# Patient Record
Sex: Male | Born: 1955 | Race: Black or African American | Hispanic: No | Marital: Married | Smoking: Former smoker
Health system: Southern US, Community
[De-identification: ages and names within clinical notes are randomized; demographics above are authoritative.]

## PROBLEM LIST (undated history)

## (undated) DIAGNOSIS — IMO0002 Reserved for concepts with insufficient information to code with codable children: Secondary | ICD-10-CM

## (undated) DIAGNOSIS — G4733 Obstructive sleep apnea (adult) (pediatric): Secondary | ICD-10-CM

## (undated) DIAGNOSIS — K5792 Diverticulitis of intestine, part unspecified, without perforation or abscess without bleeding: Secondary | ICD-10-CM

## (undated) DIAGNOSIS — Z87442 Personal history of urinary calculi: Secondary | ICD-10-CM

## (undated) DIAGNOSIS — I10 Essential (primary) hypertension: Secondary | ICD-10-CM

## (undated) DIAGNOSIS — E0500 Thyrotoxicosis with diffuse goiter without thyrotoxic crisis or storm: Secondary | ICD-10-CM

## (undated) DIAGNOSIS — E119 Type 2 diabetes mellitus without complications: Secondary | ICD-10-CM

## (undated) HISTORY — PX: KNEE SURGERY: SHX244

## (undated) HISTORY — DX: Obstructive sleep apnea (adult) (pediatric): G47.33

## (undated) HISTORY — DX: Essential (primary) hypertension: I10

## (undated) HISTORY — DX: Type 2 diabetes mellitus without complications: E11.9

## (undated) HISTORY — DX: Thyrotoxicosis with diffuse goiter without thyrotoxic crisis or storm: E05.00

## (undated) HISTORY — DX: Diverticulitis of intestine, part unspecified, without perforation or abscess without bleeding: K57.92

## (undated) HISTORY — PX: APPENDECTOMY: SHX54

## (undated) MED ORDER — OXYCODONE-ACETAMINOPHEN 5 MG-325 MG TAB
5-325 mg | ORAL_TABLET | ORAL | Status: DC | PRN
Start: ? — End: 2014-04-17

## (undated) MED ORDER — TAMSULOSIN SR 0.4 MG 24 HR CAP
0.4 mg | ORAL_CAPSULE | Freq: Every day | ORAL | Status: AC
Start: ? — End: 2012-08-22

## (undated) MED ORDER — ONDANSETRON HCL 4 MG TAB
4 mg | ORAL_TABLET | Freq: Three times a day (TID) | ORAL | Status: DC | PRN
Start: ? — End: 2014-04-17

---

## 2008-04-26 LAB — URINALYSIS W/ RFLX MICROSCOPIC
Bilirubin UA, confirm: NEGATIVE
Bilirubin: NEGATIVE
Blood: NEGATIVE
Glucose: NEGATIVE MG/DL
Leukocyte Esterase: NEGATIVE
Nitrites: NEGATIVE
Protein: 100 MG/DL — AB
Specific gravity: >1.03 — ABNORMAL HIGH (ref 1.003–1.030)
Urobilinogen: 0.2 EU/DL (ref 0.2–1.0)
pH (UA): 5 (ref 5.0–8.0)

## 2008-04-26 LAB — CBC WITH AUTOMATED DIFF
ABS. LYMPHOCYTES: 1.5 10*3/uL (ref 0.8–3.5)
ABS. MONOCYTES: 0.5 10*3/uL (ref 0–1.0)
ABS. NEUTROPHILS: 9.4 10*3/uL — ABNORMAL HIGH (ref 1.8–8.0)
BASOPHILS: 0 % (ref 0–3)
EOSINOPHILS: 2 % (ref 0–5)
HCT: 39.6 % (ref 37.0–49.0)
HGB: 13.1 g/dL (ref 13.0–16.0)
LYMPHOCYTES: 13 % — ABNORMAL LOW (ref 20–51)
MCH: 30.9 PG (ref 25.0–35.0)
MCHC: 33.1 g/dL (ref 31.0–37.0)
MCV: 93.4 FL (ref 78.0–98.0)
MONOCYTES: 5 % (ref 2–9)
MPV: 8.1 FL (ref 7.4–10.4)
NEUTROPHILS: 80 % — ABNORMAL HIGH (ref 42–75)
PLATELET: 277 10*3/uL (ref 130–400)
RBC: 4.25 M/uL — ABNORMAL LOW (ref 4.50–5.30)
RDW: 12.7 % (ref 11.5–14.5)
WBC: 11.7 10*3/uL (ref 4.5–13.0)

## 2008-04-26 LAB — CARDIAC PANEL,(CK, CKMB & TROPONIN)
CK - MB: 2.2 ng/ml (ref 0.5–3.6)
CK-MB Index: 0.8 % (ref 0.0–4.0)
CK: 291 U/L — ABNORMAL HIGH (ref 35–232)
Troponin-I, QT: <0.04 NG/ML (ref 0.00–0.05)

## 2008-04-26 LAB — METABOLIC PANEL, COMPREHENSIVE
A-G Ratio: 1.3 (ref 0.8–1.7)
ALT (SGPT): 43 U/L (ref 30–65)
AST (SGOT): 22 U/L (ref 15–37)
Albumin: 4.8 g/dL (ref 3.4–5.0)
Alk. phosphatase: 84 U/L (ref 50–136)
Anion gap: 12 mmol/L (ref 5–15)
BUN/Creatinine ratio: 12 (ref 12–20)
BUN: 29 MG/DL — ABNORMAL HIGH (ref 7–18)
Bilirubin, total: 0.4 MG/DL (ref 0.1–0.9)
CO2: 24 MMOL/L (ref 21–32)
Calcium: 8.8 MG/DL (ref 8.4–10.4)
Chloride: 101 MMOL/L (ref 100–108)
Creatinine: 2.5 MG/DL — ABNORMAL HIGH (ref 0.6–1.3)
GFR est AA: 35 mL/min/{1.73_m2} — ABNORMAL LOW (ref >60)
GFR est non-AA: 29 mL/min/{1.73_m2} — ABNORMAL LOW (ref >60)
Globulin: 3.6 g/dL (ref 2.0–4.0)
Glucose: 212 MG/DL — ABNORMAL HIGH (ref 74–99)
Potassium: 5.4 MMOL/L (ref 3.5–5.5)
Protein, total: 8.4 g/dL — ABNORMAL HIGH (ref 6.4–8.2)
Sodium: 137 MMOL/L (ref 136–145)

## 2008-04-26 LAB — PTT: aPTT: 26.2 s (ref 24.6–37.7)

## 2008-04-26 LAB — PROTHROMBIN TIME + INR
INR: 1 (ref 0.0–1.2)
Prothrombin time: 12.8 SECS (ref 11.5–15.2)

## 2008-04-26 LAB — URINE MICROSCOPIC ONLY
RBC: 0 /HPF (ref 0–5)
WBC: 0 /HPF (ref 0–5)

## 2008-04-27 LAB — CBC WITH AUTOMATED DIFF
ABS. LYMPHOCYTES: 2.1 10*3/uL (ref 0.8–3.5)
ABS. MONOCYTES: 0.6 10*3/uL (ref 0–1.0)
ABS. NEUTROPHILS: 5.7 10*3/uL (ref 1.8–8.0)
BASOPHILS: 1 % (ref 0–3)
EOSINOPHILS: 4 % (ref 0–5)
HCT: 35.5 % — ABNORMAL LOW (ref 37.0–49.0)
HGB: 11.9 g/dL — ABNORMAL LOW (ref 13.0–16.0)
LYMPHOCYTES: 24 % (ref 20–51)
MCH: 31.3 PG (ref 25.0–35.0)
MCHC: 33.6 g/dL (ref 31.0–37.0)
MCV: 93.2 FL (ref 78.0–98.0)
MONOCYTES: 7 % (ref 2–9)
MPV: 8.4 FL (ref 7.4–10.4)
NEUTROPHILS: 64 % (ref 42–75)
PLATELET: 239 10*3/uL (ref 130–400)
RBC: 3.8 M/uL — ABNORMAL LOW (ref 4.50–5.30)
RDW: 12.8 % (ref 11.5–14.5)
WBC: 8.9 10*3/uL (ref 4.5–13.0)

## 2008-04-27 LAB — TSH 3RD GENERATION: TSH: 10.13 u[IU]/mL — ABNORMAL HIGH (ref 0.51–6.27)

## 2008-04-27 LAB — METABOLIC PANEL, BASIC
Anion gap: 9 mmol/L (ref 5–15)
BUN/Creatinine ratio: 15 (ref 12–20)
BUN: 29 MG/DL — ABNORMAL HIGH (ref 7–18)
CO2: 26 MMOL/L (ref 21–32)
Calcium: 8.4 MG/DL (ref 8.4–10.4)
Chloride: 102 MMOL/L (ref 100–108)
Creatinine: 1.9 MG/DL — ABNORMAL HIGH (ref 0.6–1.3)
GFR est AA: 48 mL/min/{1.73_m2} — ABNORMAL LOW (ref >60)
GFR est non-AA: 39 mL/min/{1.73_m2} — ABNORMAL LOW (ref >60)
Glucose: 217 MG/DL — ABNORMAL HIGH (ref 74–99)
Potassium: 4.3 MMOL/L (ref 3.5–5.5)
Sodium: 137 MMOL/L (ref 136–145)

## 2008-04-27 LAB — CARDIAC PANEL,(CK, CKMB & TROPONIN)
CK - MB: 1.9 ng/ml (ref 0.5–3.6)
CK - MB: 1.9 ng/ml (ref 0.5–3.6)
CK - MB: 1.3 ng/ml (ref 0.5–3.6)
CK-MB Index: 0.8 % (ref 0.0–4.0)
CK-MB Index: 0.8 % (ref 0.0–4.0)
CK-MB Index: 0.6 % (ref 0.0–4.0)
CK: 246 U/L — ABNORMAL HIGH (ref 35–232)
CK: 233 U/L — ABNORMAL HIGH (ref 35–232)
CK: 220 U/L (ref 35–232)
Troponin-I, QT: <0.04 NG/ML (ref 0.00–0.05)
Troponin-I, QT: <0.04 NG/ML (ref 0.00–0.05)
Troponin-I, QT: <0.04 NG/ML (ref 0.00–0.05)

## 2008-04-27 LAB — PHOSPHORUS: Phosphorus: 4.2 MG/DL (ref 2.5–4.9)

## 2008-04-27 LAB — LIPID PANEL
CHOL/HDL Ratio: 3 (ref 0–5.0)
Cholesterol, total: 145 MG/DL (ref 0–200)
HDL Cholesterol: 49 MG/DL (ref 40–60)
LDL, calculated: 65 MG/DL (ref 0–100)
Triglyceride: 155 MG/DL — ABNORMAL HIGH (ref 0–150)
VLDL, calculated: 31 MG/DL

## 2008-04-27 LAB — GLUCOSE, POC: Glucose (POC): 232 mg/dL — ABNORMAL HIGH (ref 70–110)

## 2008-04-27 LAB — MAGNESIUM: Magnesium: 1.9 MG/DL (ref 1.8–2.4)

## 2012-08-15 LAB — CBC WITH AUTOMATED DIFF
ABS. BASOPHILS: 0.1 10*3/uL — ABNORMAL HIGH (ref 0.0–0.06)
ABS. EOSINOPHILS: 0.2 10*3/uL (ref 0.0–0.4)
ABS. LYMPHOCYTES: 1.7 10*3/uL (ref 0.9–3.6)
ABS. MONOCYTES: 1.1 10*3/uL (ref 0.05–1.2)
ABS. NEUTROPHILS: 10.5 10*3/uL — ABNORMAL HIGH (ref 1.8–8.0)
BASOPHILS: 1 % (ref 0–2)
EOSINOPHILS: 2 % (ref 0–5)
HCT: 37 % (ref 36.0–48.0)
HGB: 11.8 g/dL — ABNORMAL LOW (ref 13.0–16.0)
LYMPHOCYTES: 13 % — ABNORMAL LOW (ref 21–52)
MCH: 26.3 PG (ref 24.0–34.0)
MCHC: 31.9 g/dL (ref 31.0–37.0)
MCV: 82.4 FL (ref 74.0–97.0)
MONOCYTES: 8 % (ref 3–10)
MPV: 9.9 FL (ref 9.2–11.8)
NEUTROPHILS: 76 % — ABNORMAL HIGH (ref 40–73)
PLATELET: 283 10*3/uL (ref 135–420)
RBC: 4.49 M/uL — ABNORMAL LOW (ref 4.70–5.50)
RDW: 15.6 % — ABNORMAL HIGH (ref 11.6–14.5)
WBC: 13.6 10*3/uL — ABNORMAL HIGH (ref 4.6–13.2)

## 2012-08-15 MED ADMIN — sodium chloride (NS) flush 5-10 mL: INTRAVENOUS | @ 23:00:00 | NDC 87701099893

## 2012-08-15 MED ADMIN — 0.9% sodium chloride infusion 1,000 mL: INTRAVENOUS | @ 23:00:00 | NDC 00409798309

## 2012-08-15 MED ADMIN — HYDROmorphone (PF) (DILAUDID) injection 1 mg: INTRAVENOUS | @ 23:00:00 | NDC 00409128331

## 2012-08-15 MED ADMIN — ketorolac (TORADOL) injection 15 mg: INTRAVENOUS | @ 23:00:00 | NDC 00409379501

## 2012-08-15 MED ADMIN — iohexol in sterile water irrigation oral solution: ORAL | @ 23:00:00 | NDC 00407141210

## 2012-08-15 MED FILL — HYDROMORPHONE (PF) 1 MG/ML IJ SOLN: 1 mg/mL | INTRAMUSCULAR | Qty: 1

## 2012-08-15 MED FILL — BD POSIFLUSH NORMAL SALINE 0.9 % INJECTION SYRINGE: INTRAMUSCULAR | Qty: 10

## 2012-08-15 MED FILL — SODIUM CHLORIDE 0.9 % IV: INTRAVENOUS | Qty: 1000

## 2012-08-15 MED FILL — KETOROLAC TROMETHAMINE 30 MG/ML INJECTION: 30 mg/mL (1 mL) | INTRAMUSCULAR | Qty: 1

## 2012-08-15 MED FILL — OMNIPAQUE 240 MG IODINE/ML INTRAVENOUS SOLUTION: 240 mg iodine/mL | INTRAVENOUS | Qty: 50

## 2012-08-15 NOTE — ED Notes (Signed)
While in room collecting pt urine specimen, pt states "did I catch shift change?" this nurse replied "no, why?", pt states "well I thought I came to the Emergency room, I am just questioning that in my mind", pt having increased pain , provider aware and orders received, pt has finished contrast and is awaiting time frame for CT scan.

## 2012-08-15 NOTE — ED Notes (Signed)
Pt discharged to home in care of family/friend, instructed not to drive, questions answered, denies further questions, verbalized understanding of instructions.  Arm band removed and shredded. Pt advised to not drive or work while taking prescribed medication. Pt educated regarding pain management and any pain medications prescribed, pt also educated on non pharmacologic pain management.

## 2012-08-15 NOTE — ED Notes (Signed)
Patient does not know his medications. Patient was less than cooperative when answering questions in triage.

## 2012-08-15 NOTE — ED Provider Notes (Signed)
HPI Comments: 7:07 PM  57 y.o. male presents to ED C/O LLQ abd pain onset 1 hour ago. Pt states he thinks his diverticulitis is flaring up. He had an operation taking out part of his colon. He was seen here in 2006 with the same sxs but on the R side. His pain is not as bad as it was .5 hours ago. He had 2-4 episodes of vomiting.   Pt denies any other sxs or complaints.     The history is provided by the patient. No language interpreter was used.        Past Medical History   Diagnosis Date   ??? Hypertension    ??? Diabetes    ??? Diverticulitis         No past surgical history on file.      No family history on file.     History     Social History   ??? Marital Status: MARRIED     Spouse Name: N/A     Number of Children: N/A   ??? Years of Education: N/A     Occupational History   ??? Not on file.     Social History Main Topics   ??? Smoking status: Never Smoker    ??? Smokeless tobacco: Not on file   ??? Alcohol Use: No   ??? Drug Use: No   ??? Sexually Active: Not on file     Other Topics Concern   ??? Not on file     Social History Narrative   ??? No narrative on file                  ALLERGIES: Review of patient's allergies indicates no known allergies.      Review of Systems   Gastrointestinal: Positive for nausea, vomiting and abdominal pain.       Filed Vitals:    08/15/12 2000 08/15/12 2034 08/15/12 2036 08/15/12 2045   BP: 132/111 144/82  146/83   Pulse: 87  89 91   Temp:       Resp: 17  20 16    Height:       Weight:       SpO2: 95%  99% 95%            Physical Exam   Nursing note and vitals reviewed.  Constitutional: He is oriented to person, place, and time. Vital signs are normal. He appears well-developed and well-nourished. He is active.  Non-toxic appearance. He does not appear ill.   HENT:   Head: Normocephalic and atraumatic.   Eyes: Conjunctivae and EOM are normal. Pupils are equal, round, and reactive to light.   Neck: Normal range of motion. Neck supple. Carotid bruit is not present. No tracheal deviation present. No  thyromegaly present.   Cardiovascular: Normal rate, regular rhythm and normal heart sounds.  Exam reveals no gallop and no friction rub.    No murmur heard.  Pulmonary/Chest: Effort normal and breath sounds normal. No stridor. No respiratory distress. He has no wheezes. He has no rales. He exhibits no tenderness.   Abdominal: Soft. Bowel sounds are normal. He exhibits no distension and no mass. There is no tenderness. There is no rebound, no guarding and no CVA tenderness.   Musculoskeletal: Normal range of motion.   Neurological: He is alert and oriented to person, place, and time.   Skin: Skin is warm, dry and intact. No pallor.   Psychiatric: He has a normal mood and affect.  His speech is normal and behavior is normal. Judgment and thought content normal.        RESULTS    EKG FINDING:  7:32 PM  Normal Sinus Rhythm; rate: 92 bpm; Read by Estella Husk, MD at 1932  Labs Reviewed   METABOLIC PANEL, COMPREHENSIVE - Abnormal; Notable for the following:     Sodium 147 (*)     Glucose 151 (*)     Creatinine 1.63 (*)     BUN/Creatinine ratio 6 (*)     GFR est AA 56 (*)     GFR est non-AA 46 (*)     Calcium 8.0 (*)     All other components within normal limits   CBC WITH AUTOMATED DIFF - Abnormal; Notable for the following:     WBC 13.6 (*)     RBC 4.49 (*)     HGB 11.8 (*)     RDW 15.6 (*)     NEUTROPHILS 76 (*)     LYMPHOCYTES 13 (*)     ABS. NEUTROPHILS 10.5 (*)     ABS. BASOPHILS 0.1 (*)     All other components within normal limits   URINALYSIS W/ RFLX MICROSCOPIC - Abnormal; Notable for the following:     Glucose 100 (*)     Blood LARGE (*)     All other components within normal limits   URINE MICROSCOPIC ONLY - Abnormal; Notable for the following:     Bacteria FEW (*)     All other components within normal limits     Recent Results (from the past 12 hour(s))   METABOLIC PANEL, COMPREHENSIVE    Collection Time     08/15/12  7:15 PM       Result Value Range    Sodium 147 (*) 136 - 145 mmol/L    Potassium 3.8  3.5 -  5.5 mmol/L    Chloride 106  100 - 108 mmol/L    CO2 27  21 - 32 mmol/L    Anion gap 14  3.0 - 18 mmol/L    Glucose 151 (*) 74 - 99 mg/dL    BUN 10  7.0 - 18 MG/DL    Creatinine 1.61 (*) 0.6 - 1.3 MG/DL    BUN/Creatinine ratio 6 (*) 12 - 20      GFR est AA 56 (*) >60 ml/min/1.49m2    GFR est non-AA 46 (*) >60 ml/min/1.15m2    Calcium 8.0 (*) 8.5 - 10.1 MG/DL    Bilirubin, total 0.3  0.2 - 1.0 MG/DL    ALT 45  09.6 - 04.5 U/L    AST 33  15 - 37 U/L    Alk. phosphatase 63  45 - 117 U/L    Protein, total 8.0  6.4 - 8.2 g/dL    Albumin 4.0  3.4 - 5.0 g/dL    Globulin 4.0  2.0 - 4.0 g/dL    A-G Ratio 1.0  0.8 - 1.7     CBC WITH AUTOMATED DIFF    Collection Time     08/15/12  7:15 PM       Result Value Range    WBC 13.6 (*) 4.6 - 13.2 K/uL    RBC 4.49 (*) 4.70 - 5.50 M/uL    HGB 11.8 (*) 13.0 - 16.0 g/dL    HCT 40.9  81.1 - 91.4 %    MCV 82.4  74.0 - 97.0 FL    MCH 26.3  24.0 - 34.0 PG  MCHC 31.9  31.0 - 37.0 g/dL    RDW 96.0 (*) 45.4 - 14.5 %    PLATELET 283  135 - 420 K/uL    MPV 9.9  9.2 - 11.8 FL    NEUTROPHILS 76 (*) 40 - 73 %    LYMPHOCYTES 13 (*) 21 - 52 %    MONOCYTES 8  3 - 10 %    EOSINOPHILS 2  0 - 5 %    BASOPHILS 1  0 - 2 %    ABS. NEUTROPHILS 10.5 (*) 1.8 - 8.0 K/UL    ABS. LYMPHOCYTES 1.7  0.9 - 3.6 K/UL    ABS. MONOCYTES 1.1  0.05 - 1.2 K/UL    ABS. EOSINOPHILS 0.2  0.0 - 0.4 K/UL    ABS. BASOPHILS 0.1 (*) 0.0 - 0.06 K/UL    DF AUTOMATED     EKG, 12 LEAD, INITIAL    Collection Time     08/15/12  7:24 PM       Result Value Range    Ventricular Rate 92      Atrial Rate 92      P-R Interval 164      QRS Duration 84      Q-T Interval 364      QTC Calculation (Bezet) 450      Calculated P Axis 52      Calculated R Axis -14      Calculated T Axis 15      Diagnosis        Value: Normal sinus rhythm      Normal ECG      When compared with ECG of 26-Apr-2008 12:44,      fusion complexes are no longer present   URINALYSIS W/ RFLX MICROSCOPIC    Collection Time     08/15/12  8:28 PM       Result Value Range    Color  STRAW      Appearance CLEAR      Specific gravity 1.020  1.003 - 1.030      pH (UA) 7.5  5.0 - 8.0      Protein NEGATIVE   NEGATIVE mg/dL    Glucose 098 (*) NEGATIVE mg/dL    Ketone NEGATIVE   NEGATIVE mg/dL    Bilirubin NEGATIVE   NEGATIVE    Blood LARGE (*) NEGATIVE    Urobilinogen 1.0  0.2 - 1.0 EU/dL    Nitrites NEGATIVE   NEGATIVE    Leukocyte Esterase NEGATIVE   NEGATIVE   URINE MICROSCOPIC ONLY    Collection Time     08/15/12  8:28 PM       Result Value Range    WBC 0 to 1  0 - 5 /hpf    RBC 20 to 25  0 - 5 /hpf    Epithelial cells FEW  0 - 5 /lpf    Bacteria FEW (*) NEGATIVE /hpf         Past Medical History   Diagnosis Date   ??? Hypertension    ??? Diabetes    ??? Diverticulitis       CT ABD PELV W CONT    Final Result: Impression:     1. Left lower pole renal calculus. Left acute obstructive uropathy secondary to    mid left ureteral calculus.    2. Gastric bypass. Fibrotic streaks right lower lobe not present previously.    Interval resolution postoperative findings present on prior study.  XR CHEST PA LAT    (Results Pending)        MDM     Differential Diagnosis; Clinical Impression; Plan:     DDx: Diverticulitis, Bowel rotation of gut, Gastric perforation  Amount and/or Complexity of Data Reviewed:   Clinical lab tests:  Ordered and reviewed  Tests in the radiology section of CPT??:  Ordered and reviewed (Abd CT, CXR)  Tests in the medicine section of the CPT??:  Ordered and reviewed (EKG)  Discussion of test results with the performing providers:  No   Decide to obtain previous medical records or to obtain history from someone other than the patient:  No   Obtain history from someone other than the patient:  No   Review and summarize past medical records:  No   Discuss the patient with another provider:  No   Independant visualization of image, tracing, or specimen:  Yes      MEDICATIONS  Medications   sodium chloride (NS) flush 5-10 mL (10 mL IntraVENous Given 08/15/12 2249)   HYDROmorphone (PF)  (DILAUDID) injection 1 mg (1 mg IntraVENous Given 08/15/12 1925)   ketorolac (TORADOL) injection 15 mg (15 mg IntraVENous Given 08/15/12 1924)   0.9% sodium chloride infusion 1,000 mL (1,000 mL IntraVENous Given 08/15/12 1924)   iohexol in sterile water irrigation oral solution ( Oral Given 08/15/12 1925)   HYDROmorphone (PF) (DILAUDID) injection 1 mg (1 mg IntraVENous Given 08/15/12 2039)   ioversol (OPTIRAY) 320 mg iodine/mL contrast injection 100 mL (100 mL IntraVENous Given 08/15/12 2151)   fentaNYL citrate (PF) injection 50 mcg (50 mcg IntraVENous Given 08/15/12 2248)         Procedures    PROGRESS NOTES    PROGRESS NOTE:   8:47 PM  Pt has been reassessed. His pain has improved.   Written by Orlean Bradford, ED Scribe, as dictated by Romero Liner, PA-C.      10:58 PM    Dannial Monarch Stillinger's  results have been reviewed with him.  He has been counseled regarding his diagnosis, treatment, and plan.  He verbally conveys understanding and agreement of the signs, symptoms, diagnosis, treatment and prognosis and additionally agrees to follow up as discussed.  He also agrees with the care-plan and conveys that all of his questions have been answered.  I have also provided discharge instructions for him that include: educational information regarding their diagnosis and treatment, and list of reasons why they would want to return to the ED prior to their follow-up appointment, should his condition change.   ________________________________________________________________________  CLINICAL IMPRESSION    1. Chronic renal failure    2. Kidney stone           After Visit Plan:  1. Rx for Zofran  2. Rx for Flomax  3. Rx for Percocet  Return to ED if Sxs worsen.    Written by Duwaine Maxin, ED Scribe, as dictated by Estella Husk, MD.

## 2012-08-15 NOTE — ED Notes (Signed)
Pt states one hour PTA he developed LLQ abdominal pain, pt states pain is similar to previous episodes of diverticulitis, pt is not willing to answer any questions at this time due to pain, orders completed and will further assess pt when more comfortable.  Pt requested that his shirt be cut off due to discomfort.

## 2012-08-15 NOTE — ED Notes (Signed)
Assisted pt up to bedside to void, pt states he cannot feel any pain

## 2012-08-15 NOTE — ED Notes (Signed)
Pt talking on his cell phone, states his pain is now at 4/10 "and coming back". Provider made aware.

## 2012-08-15 NOTE — ED Notes (Signed)
Patient states that he had LLQ pain that started 1 hour PTA

## 2012-08-15 NOTE — ED Notes (Signed)
Pt resting on stretcher, NAD, pt denies needs, pt medicated per order.

## 2012-08-15 NOTE — ED Notes (Signed)
Pt made aware of status, pt verbalized understanding.

## 2012-08-16 LAB — METABOLIC PANEL, COMPREHENSIVE
A-G Ratio: 1 (ref 0.8–1.7)
ALT (SGPT): 45 U/L (ref 12.0–78.0)
AST (SGOT): 33 U/L (ref 15–37)
Albumin: 4 g/dL (ref 3.4–5.0)
Alk. phosphatase: 63 U/L (ref 45–117)
Anion gap: 14 mmol/L (ref 3.0–18)
BUN/Creatinine ratio: 6 — ABNORMAL LOW (ref 12–20)
BUN: 10 MG/DL (ref 7.0–18)
Bilirubin, total: 0.3 MG/DL (ref 0.2–1.0)
CO2: 27 mmol/L (ref 21–32)
Calcium: 8 MG/DL — ABNORMAL LOW (ref 8.5–10.1)
Chloride: 106 mmol/L (ref 100–108)
Creatinine: 1.63 MG/DL — ABNORMAL HIGH (ref 0.6–1.3)
GFR est AA: 56 mL/min/{1.73_m2} — ABNORMAL LOW (ref >60)
GFR est non-AA: 46 mL/min/{1.73_m2} — ABNORMAL LOW (ref >60)
Globulin: 4 g/dL (ref 2.0–4.0)
Glucose: 151 mg/dL — ABNORMAL HIGH (ref 74–99)
Potassium: 3.8 mmol/L (ref 3.5–5.5)
Protein, total: 8 g/dL (ref 6.4–8.2)
Sodium: 147 mmol/L — ABNORMAL HIGH (ref 136–145)

## 2012-08-16 LAB — URINE MICROSCOPIC ONLY
RBC: 20 /hpf (ref 0–5)
WBC: 0 /hpf (ref 0–5)

## 2012-08-16 LAB — EKG, 12 LEAD, INITIAL
Atrial Rate: 92 {beats}/min
Calculated P Axis: 52 degrees
Calculated R Axis: -14 degrees
Calculated T Axis: 15 degrees
Diagnosis: NORMAL
P-R Interval: 164 ms
Q-T Interval: 364 ms
QRS Duration: 84 ms
QTC Calculation (Bezet): 450 ms
Ventricular Rate: 92 {beats}/min

## 2012-08-16 LAB — URINALYSIS W/ RFLX MICROSCOPIC
Bilirubin: NEGATIVE
Glucose: 100 mg/dL — AB
Ketone: NEGATIVE mg/dL
Leukocyte Esterase: NEGATIVE
Nitrites: NEGATIVE
Protein: NEGATIVE mg/dL
Specific gravity: 1.02 (ref 1.003–1.030)
Urobilinogen: 1 EU/dL (ref 0.2–1.0)
pH (UA): 7.5 (ref 5.0–8.0)

## 2012-08-16 MED ADMIN — sodium chloride (NS) flush 5-10 mL: INTRAVENOUS | @ 01:00:00 | NDC 87701099893

## 2012-08-16 MED ADMIN — fentaNYL citrate (PF) injection 50 mcg: INTRAVENOUS | @ 03:00:00 | NDC 00641602701

## 2012-08-16 MED ADMIN — sodium chloride (NS) flush 5-10 mL: INTRAVENOUS | @ 03:00:00 | NDC 87701099893

## 2012-08-16 MED ADMIN — HYDROmorphone (PF) (DILAUDID) injection 1 mg: INTRAVENOUS | @ 01:00:00 | NDC 00409128331

## 2012-08-16 MED ADMIN — ioversol (OPTIRAY) 320 mg iodine/mL contrast injection 100 mL: INTRAVENOUS | @ 02:00:00 | NDC 00019132390

## 2012-08-16 MED FILL — OPTIRAY 320 MG IODINE/ML INTRAVENOUS SYRINGE: 320 mg iodine/mL | INTRAVENOUS | Qty: 100

## 2012-08-16 MED FILL — HYDROMORPHONE (PF) 1 MG/ML IJ SOLN: 1 mg/mL | INTRAMUSCULAR | Qty: 1

## 2012-08-16 MED FILL — FENTANYL CITRATE (PF) 50 MCG/ML IJ SOLN: 50 mcg/mL | INTRAMUSCULAR | Qty: 2

## 2012-08-22 LAB — CBC W/O DIFF
HCT: 34 % — ABNORMAL LOW (ref 36.0–48.0)
HGB: 10.6 g/dL — ABNORMAL LOW (ref 13.0–16.0)
MCH: 26.5 PG (ref 24.0–34.0)
MCHC: 31.2 g/dL (ref 31.0–37.0)
MCV: 85 FL (ref 74.0–97.0)
MPV: 10.2 FL (ref 9.2–11.8)
PLATELET: 306 10*3/uL (ref 135–420)
RBC: 4 M/uL — ABNORMAL LOW (ref 4.70–5.50)
RDW: 16.2 % — ABNORMAL HIGH (ref 11.6–14.5)
WBC: 8.1 10*3/uL (ref 4.6–13.2)

## 2012-08-22 LAB — URINALYSIS W/ RFLX MICROSCOPIC
Bilirubin: NEGATIVE
Blood: NEGATIVE
Glucose: 100 mg/dL — AB
Ketone: NEGATIVE mg/dL
Leukocyte Esterase: NEGATIVE
Nitrites: NEGATIVE
Protein: NEGATIVE mg/dL
Specific gravity: >1.03 — ABNORMAL HIGH (ref 1.003–1.030)
Urobilinogen: 0.2 EU/dL (ref 0.2–1.0)
pH (UA): 5.5 (ref 5.0–8.0)

## 2012-08-22 LAB — METABOLIC PANEL, BASIC
Anion gap: 12 mmol/L (ref 3.0–18)
BUN/Creatinine ratio: 10 — ABNORMAL LOW (ref 12–20)
BUN: 14 MG/DL (ref 7.0–18)
CO2: 26 mmol/L (ref 21–32)
Calcium: 8.9 MG/DL (ref 8.5–10.1)
Chloride: 105 mmol/L (ref 100–108)
Creatinine: 1.38 MG/DL — ABNORMAL HIGH (ref 0.6–1.3)
GFR est AA: >60 mL/min/{1.73_m2} (ref >60)
GFR est non-AA: 56 mL/min/{1.73_m2} — ABNORMAL LOW (ref >60)
Glucose: 236 mg/dL — ABNORMAL HIGH (ref 74–99)
Potassium: 4.7 mmol/L (ref 3.5–5.5)
Sodium: 143 mmol/L (ref 136–145)

## 2012-08-22 LAB — HEMOGLOBIN A1C WITH EAG: Hemoglobin A1c: 8.1 % — ABNORMAL HIGH (ref 4.5–6.2)

## 2012-08-23 LAB — CULTURE, URINE
Culture result:: NO GROWTH
Culture: NO GROWTH

## 2012-09-01 NOTE — H&P (Signed)
Chief Complaint: Right knee pain following injury exiting a helicopter in [place].    History of Present Illness:  Thank you for this new patient consultation.   Gary Walter is a 57 year old gentleman who works as a Web designer and injured his right knee on 06/02/12 while he was in [place].  At that time, he was evaluated and had x-rays performed, as well as an MRI for evaluation and there were concerns for multiple findings; subluxation of the kneecap, possible tumor and a possible meniscal injury.  Since that time, he had an injection at some point, which did change some of his symptoms.  He continued to have pain though.  He continued to work and has most [place], approximately one [place] ago.  He has been continued using the knee, but feels that something is not right, mostly about the medial aspect with occasional symptoms with deep squatting.  He notes that he did do this while he was away.  While he was in the helicopter, he had a twisting motion coming down on the knee and has had pain about the medial aspect since that time.    Past Medical History:  Positive for diabetes and high blood pressure.    Past Surgical History:  Appendectomy.    Allergies:  No known drug allergies.    Current Medications: Metformin 500 mg and Lotrel unknown dose.    Social History: The patient does not smoke or drink alcohol.  There is no history of prior alcoholism, drug abuse, or treatment for either.    Family History:  The patient significant family history positive for heart disease.    Review of Systems:  Positive for joint pain; otherwise as per HPI negative for fever, chills, nausea, vomiting, muscle weakness, chest pain, difficulty with breathing, abdominal pain, urinary, or bowel symptoms.    Physical Examination:    Constitutional:  Weight 289 pounds, height 6 feet, blood pressure 112/81, pulse is 90.  Psychiatric/General:  No acute distress; pleasant and accommodating.  HEENT: Moist  mucous membranes intact.  Lymphadenopathy:  Neck supple; no lymphadenopathy upon evaluation.  Respiratory:  Equal bilateral chest wall movement with inspiration; no wheezes or strider audible.  Cardiovascular:  Regular rate and rhythm, as noted by upper extremity pulses.  Extremities:  Evaluation of right lower extremity noted mild effusion in the right knee, but good overall range of motion with flexion and extension from two to three degrees full extension with flexion to approximately 115 degrees.  There is mild pain about the medial aspect of the knee and mild varus deformity grossly upon evaluation at three to five degrees.  Sensation is full and intact throughout the lower extremity without gross focal deficits.  There is 5/5 muscle strength throughout.  There is no erythema, no skin change, no ulcerations, and no lesions.      Data/Radiograph Evaluation:  MRI dated 08/10/12 is reviewed and noted to have a couple of findings with generally intact ligament.  There is some significance of a tear in the posterior horn of the medial meniscus, as well as a small foci of signal change in the posterior and medial aspect of the medial tibial plateau.  There seems to be a cyst formation.  Additionally, there is a small Baker???s cyst in the posterior aspect and a mild chondromalacia change of the femoral trochlea.      Assessment/Plan:  At this time, it seems that Gary Walter???s symptoms continue to be in the posterior aspect of  the tibia on the medial side, which is consistent with findings of a posterior horn tear in the medial meniscus on the right side.  We will plan for a surgical arthroscopy and debridement of this lesion.  Risks and benefits were reviewed that include infection, bleeding, deep venous thrombosis, pulmonary embolism, heart attack, stroke, death, arthrofibrosis, need for manipulation, neurovascular compromise, need for revision surgical intervention, persistent long-term pain, need for chronic pain  management, and metallic allergies. Additionally, the area of what seems to be a cyst formation in the subchondral bone of the posterior layer.  I do not feel that this is a significant concerning issue at this time without any additional concerns for any ongoing malignancy processes.  We will plan for a surgical arthroscopy of her right knee with debridement of the posterior medial meniscus.

## 2012-09-02 ENCOUNTER — Inpatient Hospital Stay: Payer: Worker's Compensation

## 2012-09-02 DIAGNOSIS — M23329 Other meniscus derangements, posterior horn of medial meniscus, unspecified knee: Secondary | ICD-10-CM | POA: Insufficient documentation

## 2012-09-02 LAB — GLUCOSE, POC: Glucose (POC): 125 mg/dL — ABNORMAL HIGH (ref 70–110)

## 2012-09-02 MED ADMIN — lactated ringers infusion: INTRAVENOUS | @ 10:00:00 | NDC 00409795309

## 2012-09-02 MED ADMIN — HYDROmorphone (DILAUDID) injection 0.5 mg: INTRAVENOUS | @ 13:00:00 | NDC 00641012121

## 2012-09-02 MED ADMIN — EPINEPHrine (PF) 1 mL in lactated ringers 3,000 mL Irrigation: @ 12:00:00 | NDC 00409795309

## 2012-09-02 MED ADMIN — bupivacaine-EPINEPHrine (PF) 0.5 %-1:200,000 30 mL, morphine 4 mg iv solution: INTRAMUSCULAR | @ 12:00:00 | NDC 00409174929

## 2012-09-02 MED ADMIN — HYDROmorphone (DILAUDID) injection 0.5 mg: INTRAVENOUS | @ 12:00:00 | NDC 00641012121

## 2012-09-02 MED ADMIN — ceFAZolin (ANCEF) 2g IVPB in 50 mL D5W: INTRAVENOUS | @ 12:00:00 | NDC 00264310511

## 2012-09-02 MED FILL — EPINEPHRINE (PF) 1 MG/ML INJECTION: 1 mg/mL ( mL) | INTRAMUSCULAR | Qty: 1

## 2012-09-02 MED FILL — HYDROMORPHONE 2 MG/ML INJECTION SOLUTION: 2 mg/mL | INTRAMUSCULAR | Qty: 1

## 2012-09-02 MED FILL — MARCAINE-EPINEPHRINE (PF) 0.5 %-1:200,000 INJECTION SOLUTION: 0.5 %-1:200,000 | INTRAMUSCULAR | Qty: 30

## 2012-09-02 MED FILL — MORPHINE 4 MG/ML SYRINGE: 4 mg/mL | INTRAMUSCULAR | Qty: 1

## 2012-09-02 MED FILL — FENTANYL CITRATE (PF) 50 MCG/ML IJ SOLN: 50 mcg/mL | INTRAMUSCULAR | Qty: 5

## 2012-09-02 MED FILL — PROPOFOL 10 MG/ML IV EMUL: 10 mg/mL | INTRAVENOUS | Qty: 20

## 2012-09-02 MED FILL — LACTATED RINGERS IV: INTRAVENOUS | Qty: 1000

## 2012-09-02 MED FILL — XYLOCAINE-MPF 20 MG/ML (2 %) INJECTION SOLUTION: 20 mg/mL (2 %) | INTRAMUSCULAR | Qty: 5

## 2012-09-02 MED FILL — CEFAZOLIN 2 GM/50 ML IN DEXTROSE (ISO-OSMOTIC) IVPB: 2 gram/50 mL | INTRAVENOUS | Qty: 50

## 2012-09-02 MED FILL — ONDANSETRON (PF) 4 MG/2 ML INJECTION: 4 mg/2 mL | INTRAMUSCULAR | Qty: 2

## 2012-09-02 MED FILL — MIDAZOLAM 1 MG/ML IJ SOLN: 1 mg/mL | INTRAMUSCULAR | Qty: 2

## 2012-09-02 NOTE — Op Note (Signed)
Op Notes  by Diannia Ruder, DO at 09/02/12 0818                Author: Diannia Ruder, DO  Service: Orthopedic Surgery  Author Type: Physician       Filed: 09/02/12 0821  Date of Service: 09/02/12 0818  Status: Signed          Editor: Diannia Ruder, DO (Physician)                       OPERATIVE NOTE          Patient: Gary Walter  MRN: [id number]   SSN: [id number]          Date of Birth: 1956-01-27   Age: 57 y.o.   Sex: male         Indications: This is a 58 y.o. year-old male who presents with knee pain. He was positive for meniscal tear on MRI. The patient was admitted  for surgery as conservative measures have failed.      Date of Procedure: 09/02/2012       Preoperative Diagnosis: MEDIAL MENISCAL TEAR RIGHT KNEE      Postoperative Diagnosis: MEDIAL MENISCAL TEAR RIGHT KNEE        Procedure: Procedure(s):   RIGHT KNEE ARTHROSCOPY,PARTIAL MEDIAL MENISCECTOMY      Surgeon(s) and Role:      * Diannia Ruder, DO - Primary      Anesthesia: gen LMA      Estimated Blood Loss: 5 ml      Specimens: * No specimens in log *       Drains: none      Implants: * No implants in log *      Complications: None; patient tolerated the procedure well.      Procedure: The patient was greeted by anesthesia and taken to the operative suite, where she underwent general endotracheal anesthesia.  The patient  was positioned in the supine position on a standard orthopedic table. The right leg was secured with a surgical assist leg holder and sterilely prepped and draped in standard fashion.  Standard inferomedial and inferolateral portals were made with a 15-blade  scalpel.  A 30-degree arthroscope was placed through the lateral portal into the suprapatellar  Pouch.  Evaluation of the patellofemoral compartment showed grade 3 changes of the cartilage surface.  Evaluation of the medial compartment showed grade 3  changes of the cartilgenus surface.  The anterior and posterior cruciate ligament were found to be  intact.  Grade 1 changes were noted over the lateral compartment.  The menesci were evaluated and probed.  The medial compartment demonstrated a horizontal  cleavage tear.  The lateral compartment demonstrated no significant abnormalities.  Arthroscopic meniscectomy was performed with a shaver where appropriated until no loose or unstable cartilage remained upon probing.  A chondroplasty was performed over  the articular surface of the MFC; lateral facet patella.  Additional procedures included partial synovectomy anterior knee.  The knee was irrigated with the remainder of the arthroscopic lavage and injected with 30 ml of .5% Marcaine with Epinephrine  and 4 mg of Morphine sulfate.  The incisions were reapproximated with 3-0 monocryl suture in subcuticular x 2 followed by steri- strips. A soft sterile dressing and an ace wrap were placed.  The patient recovered from anesthesia and was transferred to  the post-anesthesia care unit in stable condition.

## 2012-09-02 NOTE — Other (Signed)
Pt. Offered restroom while in pre-op, declined at this present time.

## 2012-09-02 NOTE — Interval H&P Note (Signed)
H&P Update:  Gary Walter was seen and examined.  History and physical has been reviewed. There have been no significant clinical changes since the completion of the originally dated History and Physical.  Patient identified by surgeon; surgical site was confirmed by patient and surgeon.  Consent obtained procedure reviewed   Plan for right knee arthroscopy    Signed By: Diannia Ruder, DO     September 02, 2012 7:03 AM

## 2012-09-02 NOTE — Progress Notes (Signed)
Post-Anesthesia Evaluation and Assessment    Patient: Gary Walter MRN: [id number]  SSN: [id number]   Date of Birth: 1955/10/14  Age: 57 y.o.  Sex: male      Cardiovascular Function/Vital Signs  BP 125/73   Pulse 65   Temp(Src) 98 ??F (36.7 ??C)   Resp 18   Ht 6' (1.829 m)   Wt 139.736 kg (308 lb 1 oz)   BMI 41.77 kg/m2   SpO2 98%    Patient is status post Procedure(s):  RIGHT KNEE ARTHROSCOPY,PARTIAL MEDIAL MENISCECTOMY.    Nausea/Vomiting: Controlled.    Postoperative hydration reviewed and adequate.    Pain:  Pain Scale 1: Numeric (0 - 10) (09/02/12 0900)  Pain Intensity 1: 3 (09/02/12 0900)   Managed.    Neurological Status:   Neuro (WDL): Within Defined Limits (09/02/12 0900)   At baseline.    Mental Status and Level of Consciousness: Arousable.    Pulmonary Status:   O2 Device: Nasal cannula (09/02/12 0845)   Adequate oxygenation and airway patent.    Complications related to anesthesia: None    Post-anesthesia assessment completed. No concerns.    Patient has met all discharge requirements.    Signed By: Trenton Founds, MD    September 02, 2012

## 2012-09-02 NOTE — Op Note (Signed)
OPERATIVE NOTE    Patient: Gary Walter MRN: [id number]  SSN: [id number]    Date of Birth: 02/25/1956  Age: 57 y.o.  Sex: male      Indications: This is a 57 y.o. year-old male who presents with knee pain. He was positive for meniscal tear on MRI. The patient was admitted for surgery as conservative measures have failed.    Date of Procedure: 09/02/2012     Preoperative Diagnosis: MEDIAL MENISCAL TEAR RIGHT KNEE    Postoperative Diagnosis: MEDIAL MENISCAL TEAR RIGHT KNEE      Procedure: Procedure(s):  RIGHT KNEE ARTHROSCOPY,PARTIAL MEDIAL MENISCECTOMY    Surgeon(s) and Role:     * Diannia Ruder, DO - Primary    Anesthesia: gen LMA    Estimated Blood Loss: 5 ml    Specimens: * No specimens in log *     Drains: none    Implants: * No implants in log *    Complications: None; patient tolerated the procedure well.    Procedure: The patient was greeted by anesthesia and taken to the operative suite, where she underwent general endotracheal anesthesia.  The patient was positioned in the supine position on a standard orthopedic table. The right leg was secured with a surgical assist leg holder and sterilely prepped and draped in standard fashion.  Standard inferomedial and inferolateral portals were made with a 15-blade scalpel.  A 30-degree arthroscope was placed through the lateral portal into the suprapatellar  Pouch.  Evaluation of the patellofemoral compartment showed grade 3 changes of the cartilage surface.  Evaluation of the medial compartment showed grade 3 changes of the cartilgenus surface.  The anterior and posterior cruciate ligament were found to be intact.  Grade 1 changes were noted over the lateral compartment.  The menesci were evaluated and probed.  The medial compartment demonstrated a horizontal cleavage tear.  The lateral compartment demonstrated no significant abnormalities.  Arthroscopic meniscectomy was performed with a shaver where appropriated until no loose or unstable cartilage  remained upon probing.  A chondroplasty was performed over the articular surface of the MFC; lateral facet patella.  Additional procedures included partial synovectomy anterior knee.  The knee was irrigated with the remainder of the arthroscopic lavage and injected with 30 ml of .5% Marcaine with Epinephrine and 4 mg of Morphine sulfate.  The incisions were reapproximated with 3-0 monocryl suture in subcuticular x 2 followed by steri- strips. A soft sterile dressing and an ace wrap were placed.  The patient recovered from anesthesia and was transferred to the post-anesthesia care unit in stable condition.

## 2012-09-02 NOTE — Other (Signed)
Ambulates wbat right leg without difficulty

## 2012-09-02 NOTE — Brief Op Note (Addendum)
BRIEF OPERATIVE NOTE    Date of Procedure: 09/02/2012   Preoperative Diagnosis: MEDIAL MENISCAL TEAR RIGHT KNEE  Postoperative Diagnosis: MEDIAL MENISCAL TEAR RIGHT KNEE    Procedure(s):  RIGHT KNEE ARTHROSCOPY,PARTIAL MEDIAL MENISCECTOMY  Surgeon(s) and Role:     * Diannia Ruder, DO - Primary  Anesthesia: General   Estimated Blood Loss: 5 ml  ivf 1100 ml lr  tt :0  Specimens: * No specimens in log *   Findings: grade 3-4 mfc/ 3 PFJ   Horizontal tear posterior medial meniscus   Complications: none  Implants: * No implants in log *      Patient stable to pacu  wbat on right  Pain control oral meds  Follow up in office 10 days  Post op instructions given

## 2012-09-02 NOTE — Other (Signed)
Report called to Wyman Songster RN, transferred to Phase 2, VSS

## 2012-09-02 NOTE — Other (Signed)
TRANSFER - IN REPORT:    Verbal report received from CRNA May-Such, on Gary Walter  being received from OR(unit) for routine post - op      Report consisted of patient???s Situation, Background, Assessment and   Recommendations(SBAR).     Information from the following report(s) SBAR, OR Summary, Procedure Summary and Intake/Output was reviewed with the receiving nurse.    Opportunity for questions and clarification was provided.      Assessment completed upon patient???s arrival to unit and care assumed. Applied 100% NRM @ 10L, SCD's applied and updated family

## 2012-09-02 NOTE — Other (Signed)
Notified Faith A. In holding that pt.'s H/H was 10.6/3.4, inquiring to see if anesthesia wants a t/s for Sx. She will ask anesthesia.

## 2012-12-29 DIAGNOSIS — G4733 Obstructive sleep apnea (adult) (pediatric): Secondary | ICD-10-CM | POA: Insufficient documentation

## 2014-04-17 ENCOUNTER — Inpatient Hospital Stay
Admit: 2014-04-17 | Discharge: 2014-04-18 | Disposition: A | Discharge status: Home / Self Care | Payer: Self-pay | Attending: Internal Medicine

## 2014-04-17 ENCOUNTER — Emergency Department: Admit: 2014-04-17 | Payer: Self-pay | Primary: Family

## 2014-04-17 DIAGNOSIS — S39012A Strain of muscle, fascia and tendon of lower back, initial encounter: Secondary | ICD-10-CM
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
## 2014-04-17 NOTE — ED Notes (Signed)
Patient armband removed and shreddedI have reviewed discharge instructions with the patient.  The patient verbalized understanding. rx x 2 given;
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
## 2014-04-18 MED ORDER — METHOCARBAMOL 500 MG TAB
500 mg | ORAL_TABLET | Freq: Four times a day (QID) | ORAL | Status: DC
Start: 2014-04-18 — End: 2014-05-16

## 2014-04-18 MED ORDER — IBUPROFEN 600 MG TAB
600 mg | ORAL_TABLET | Freq: Four times a day (QID) | ORAL | Status: AC | PRN
Start: 2014-04-18 — End: ?

## 2014-05-16 ENCOUNTER — Emergency Department: Admit: 2014-05-16 | Payer: TRICARE (CHAMPUS) | Primary: Family

## 2014-05-16 ENCOUNTER — Inpatient Hospital Stay
Admit: 2014-05-16 | Discharge: 2014-05-16 | Disposition: A | Discharge status: Home / Self Care | Payer: TRICARE (CHAMPUS) | Attending: Emergency Medicine

## 2014-05-16 DIAGNOSIS — J209 Acute bronchitis, unspecified: Secondary | ICD-10-CM

## 2014-05-16 LAB — METABOLIC PANEL, COMPREHENSIVE
A-G Ratio: 0.7 — ABNORMAL LOW (ref 0.8–1.7)
ALT (SGPT): 16 U/L (ref 16–61)
AST (SGOT): 21 U/L (ref 15–37)
Albumin: 3 g/dL — ABNORMAL LOW (ref 3.4–5.0)
Alk. phosphatase: 72 U/L (ref 45–117)
Anion gap: 9 mmol/L (ref 3.0–18)
BUN/Creatinine ratio: 8 — ABNORMAL LOW (ref 12–20)
BUN: 9 MG/DL (ref 7.0–18)
Bilirubin, total: 0.5 MG/DL (ref 0.2–1.0)
CO2: 27 mmol/L (ref 21–32)
Calcium: 8.2 MG/DL — ABNORMAL LOW (ref 8.5–10.1)
Chloride: 102 mmol/L (ref 100–108)
Creatinine: 1.19 MG/DL (ref 0.6–1.3)
GFR est AA: >60 mL/min/{1.73_m2} (ref >60)
GFR est non-AA: >60 mL/min/{1.73_m2} (ref >60)
Globulin: 4.5 g/dL — ABNORMAL HIGH (ref 2.0–4.0)
Glucose: 149 mg/dL — ABNORMAL HIGH (ref 74–99)
Potassium: 3.3 mmol/L — ABNORMAL LOW (ref 3.5–5.5)
Protein, total: 7.5 g/dL (ref 6.4–8.2)
Sodium: 138 mmol/L (ref 136–145)

## 2014-05-16 LAB — CBC WITH AUTOMATED DIFF
ABS. BASOPHILS: 0 10*3/uL (ref 0.0–0.06)
ABS. EOSINOPHILS: 0.3 10*3/uL (ref 0.0–0.4)
ABS. LYMPHOCYTES: 1.4 10*3/uL (ref 0.9–3.6)
ABS. MONOCYTES: 1.3 10*3/uL — ABNORMAL HIGH (ref 0.05–1.2)
ABS. NEUTROPHILS: 8.5 10*3/uL — ABNORMAL HIGH (ref 1.8–8.0)
BASOPHILS: 0 % (ref 0–2)
EOSINOPHILS: 2 % (ref 0–5)
HCT: 32.3 % — ABNORMAL LOW (ref 36.0–48.0)
HGB: 10.3 g/dL — ABNORMAL LOW (ref 13.0–16.0)
LYMPHOCYTES: 12 % — ABNORMAL LOW (ref 21–52)
MCH: 26.5 PG (ref 24.0–34.0)
MCHC: 31.9 g/dL (ref 31.0–37.0)
MCV: 83 FL (ref 74.0–97.0)
MONOCYTES: 11 % — ABNORMAL HIGH (ref 3–10)
MPV: 8.9 FL — ABNORMAL LOW (ref 9.2–11.8)
NEUTROPHILS: 75 % — ABNORMAL HIGH (ref 40–73)
PLATELET: 306 10*3/uL (ref 135–420)
RBC: 3.89 M/uL — ABNORMAL LOW (ref 4.70–5.50)
RDW: 15.1 % — ABNORMAL HIGH (ref 11.6–14.5)
WBC: 11.5 10*3/uL (ref 4.6–13.2)

## 2014-05-16 LAB — INFLUENZA A & B AG (RAPID TEST)
Influenza A Antigen: NEGATIVE
Influenza B Antigen: NEGATIVE

## 2014-05-16 LAB — MONONUCLEOSIS SCREEN: Mononucleosis screen: NEGATIVE

## 2014-05-16 MED ORDER — SODIUM CHLORIDE 0.9 % IV
INTRAVENOUS | Status: AC
Start: 2014-05-16 — End: 2014-05-16
  Administered 2014-05-16: 12:00:00 via INTRAVENOUS

## 2014-05-16 MED ORDER — ACETAMINOPHEN 500 MG TAB
500 mg | ORAL | Status: AC
Start: 2014-05-16 — End: 2014-05-16
  Administered 2014-05-16: 12:00:00 via ORAL

## 2014-05-16 MED ORDER — LEVOFLOXACIN 500 MG TAB
500 mg | ORAL_TABLET | Freq: Every day | ORAL | Status: AC
Start: 2014-05-16 — End: ?

## 2014-05-16 MED ORDER — CODEINE-GUAIFENESIN 10 MG-100 MG/5 ML ORAL LIQUID
100-10 mg/5 mL | Freq: Three times a day (TID) | ORAL | Status: AC | PRN
Start: 2014-05-16 — End: ?

## 2014-05-16 MED ORDER — PREDNISONE 20 MG TAB
20 mg | ORAL_TABLET | Freq: Every day | ORAL | Status: AC
Start: 2014-05-16 — End: 2014-05-21

## 2014-05-16 MED FILL — SODIUM CHLORIDE 0.9 % IV: INTRAVENOUS | Qty: 1000

## 2014-05-16 MED FILL — MAPAP EXTRA STRENGTH 500 MG TABLET: 500 mg | ORAL | Qty: 2

## 2014-05-16 NOTE — ED Notes (Signed)
Cold symptoms since last Sunday, seen by PCP this past week, given medication and not working, now having sore throat and productive cough, left eye is draining mucus, cant eat, cant sleep

## 2014-05-16 NOTE — ED Notes (Signed)
Patient lying quietly on the bed, no obvious distress. IV fluids now infusing.

## 2014-05-16 NOTE — ED Notes (Signed)
Pt ambulated back to room from bathroom

## 2014-05-16 NOTE — ED Provider Notes (Addendum)
HPI Comments:   6:41 AM   Gary Walter is a 59 y.o. Male with hx of HTN, DMII, OA, OSA, and diverticulitis presenting to the ED C/O cold sxs including subjective fever, chills, sore throat, rhinorrhea, HA, body ache, productive cough onset 1 week ago. Also complains of redness in left eyes, mucous-like discharge from left eyes. Pt was evaluated by PCP 4 days ago at [place] and prescribed sudafed, Mucinex. Dx with "cold" per patient. Pt has not taken anything for myalgias, No chest pain or SOB. Pt endorses influenza shot this year .PMHx of DM (lantus) with typical BS in 120s-130s, HTN (compliant with meds). Denies smoking, reports social EtOH consumption. Pt denies n/v/d, hx of breathing disorders including asthma, COPD, or CHF, ill contacts, and any other Sx or complaints.      Written by Suzi Roots, as dictated by Darrin Luis, MD.     Patient is a 59 y.o. male presenting with colds. The history is provided by the patient. No language interpreter was used.   Cold  This is a new problem. Patient reports a subjective fever - was not measured.Associated symptoms include chills, eye redness, headaches, rhinorrhea, sore throat and myalgias (generalized). Pertinent negatives include no chest pain, no ear pain, no shortness of breath, no wheezing, no nausea and no vomiting. He has tried cough syrup and decongestants for the symptoms. The treatment provided no relief.        Past Medical History:   Diagnosis Date   ??? Diabetes ([place])    ??? Diverticulitis    ??? Hypertension 2010   ??? Unspecified sleep apnea      instructed  to bring CPAP day of surgery   ??? Thyroid disease    ??? Arthritis        Past Surgical History:   Procedure Laterality Date   ??? Hx colectomy     ??? Hx appendectomy     ??? Hx tonsillectomy     ??? Hx gastric bypass  2004         History reviewed. No pertinent family history.    History     Social History   ??? Marital Status: MARRIED     Spouse Name: N/A   ??? Number of Children: N/A    ??? Years of Education: N/A     Occupational History   ??? Not on file.     Social History Main Topics   ??? Smoking status: Never Smoker    ??? Smokeless tobacco: Never Used   ??? Alcohol Use: 0.0 oz/week     7-14 Cans of beer per week   ??? Drug Use: No   ??? Sexual Activity: Not on file     Other Topics Concern   ??? Not on file     Social History Narrative           ALLERGIES: Review of patient's allergies indicates no known allergies.      Review of Systems   Constitutional: Positive for fever, chills and fatigue.   HENT: Positive for congestion, postnasal drip, rhinorrhea, sinus pressure and sore throat. Negative for ear pain, facial swelling and trouble swallowing.    Eyes: Positive for discharge and redness.   Respiratory: Positive for cough. Negative for shortness of breath and wheezing.    Cardiovascular: Negative for chest pain and leg swelling.   Gastrointestinal: Negative for nausea, vomiting and abdominal pain.   Genitourinary: Negative for dysuria, flank pain and difficulty urinating.   Musculoskeletal: Positive  for myalgias (generalized). Negative for arthralgias.   Skin: Negative for color change and rash.   Neurological: Positive for headaches. Negative for dizziness, weakness, light-headedness and numbness.   Hematological: Negative for adenopathy.       Filed Vitals:    05/16/14 0632   BP: 99/66   Pulse: 99   Temp: 99.9 ??F (37.7 ??C)   Resp: 20   Height: 6' (1.829 m)   Weight: 129.275 kg (285 lb)   SpO2: 96%            Physical Exam   Constitutional: He is oriented to person, place, and time. He appears well-developed and well-nourished. No distress.   AA male in NAD. A&O. Ambulating in ED hallway to restroom. No resp distress or acc muscle use. Speaking in complete sentences. No stridor.    HENT:   Head: Normocephalic and atraumatic. Head is without right periorbital erythema and without left periorbital erythema.   Right Ear: External ear normal. No drainage or swelling. Tympanic membrane  is not perforated, not erythematous and not bulging.   Left Ear: External ear normal. No drainage or swelling. Tympanic membrane is not perforated, not erythematous and not bulging.   Nose: Mucosal edema and rhinorrhea present. Right sinus exhibits no maxillary sinus tenderness and no frontal sinus tenderness. Left sinus exhibits no maxillary sinus tenderness and no frontal sinus tenderness.   Mouth/Throat: Uvula is midline and mucous membranes are normal. No oral lesions. No trismus in the jaw. No dental abscesses or uvula swelling. Posterior oropharyngeal erythema present. No oropharyngeal exudate, posterior oropharyngeal edema or tonsillar abscesses.   Eyes: Conjunctivae are normal. Right eye exhibits no discharge. Left eye exhibits no discharge. No scleral icterus.   Neck: Normal range of motion. Neck supple.   Cardiovascular: Normal rate, regular rhythm, normal heart sounds and intact distal pulses.  Exam reveals no gallop and no friction rub.    No murmur heard.  Pulmonary/Chest: Effort normal and breath sounds normal. No accessory muscle usage. No tachypnea. No respiratory distress. He has no decreased breath sounds. He has no wheezes. He has no rhonchi. He has no rales.   Musculoskeletal: Normal range of motion. He exhibits no edema or tenderness.   Lymphadenopathy:     He has no cervical adenopathy.   Neurological: He is alert and oriented to person, place, and time.   Skin: Skin is warm and dry. He is not diaphoretic.   Psychiatric: He has a normal mood and affect. Judgment normal.   Nursing note and vitals reviewed.       RESULTS:    7:40 AM  RADIOLOGY FINDINGS  Chest X-ray shows a possible early developing R lower lobe infiltrate, otherwise Nap and no change from previous.  Recorded by Rosalio Loud, ED Scribe, as dictated by Dionne Bucy, PA-C     Labs Reviewed   CBC WITH AUTOMATED DIFF - Abnormal; Notable for the following:     RBC 3.89 (*)     HGB 10.3 (*)     HCT 32.3 (*)     RDW 15.1 (*)      MPV 8.9 (*)     NEUTROPHILS 75 (*)     LYMPHOCYTES 12 (*)     MONOCYTES 11 (*)     ABS. NEUTROPHILS 8.5 (*)     ABS. MONOCYTES 1.3 (*)     All other components within normal limits   METABOLIC PANEL, COMPREHENSIVE - Abnormal; Notable for the following:     Potassium 3.3 (*)  Glucose 149 (*)     BUN/Creatinine ratio 8 (*)     Calcium 8.2 (*)     Albumin 3.0 (*)     Globulin 4.5 (*)     A-G Ratio 0.7 (*)     All other components within normal limits   INFLUENZA A & B AG (RAPID TEST)   STREP THROAT SCREEN   MONONUCLEOSIS SCREEN       Recent Results (from the past 12 hour(s))   INFLUENZA A & B AG (RAPID TEST)    Collection Time: 05/16/14  7:00 AM   Result Value Ref Range    Influenza A Antigen NEGATIVE  NEG      Influenza B Antigen NEGATIVE  NEG     STREP THROAT SCREEN    Collection Time: 05/16/14  7:00 AM   Result Value Ref Range    Special Requests: NO SPECIAL REQUESTS      Strep Screen NEGATIVE       Strep Screen (NOTE)  PERFORMED BY [id number]         Culture result: PENDING    CBC WITH AUTOMATED DIFF    Collection Time: 05/16/14  7:21 AM   Result Value Ref Range    WBC 11.5 4.6 - 13.2 K/uL    RBC 3.89 (L) 4.Gary Walter - 5.50 M/uL    HGB 10.3 (L) 13.0 - 16.0 g/dL    HCT 32.3 (L) 36.0 - 48.0 %    MCV 83.0 74.0 - 97.0 FL    MCH 26.5 24.0 - 34.0 PG    MCHC 31.9 31.0 - 37.0 g/dL    RDW 15.1 (H) 11.6 - 14.5 %    PLATELET 306 135 - 420 K/uL    MPV 8.9 (L) 9.2 - 11.8 FL    NEUTROPHILS 75 (H) 40 - 73 %    LYMPHOCYTES 12 (L) 21 - 52 %    MONOCYTES 11 (H) 3 - 10 %    EOSINOPHILS 2 0 - 5 %    BASOPHILS 0 0 - 2 %    ABS. NEUTROPHILS 8.5 (H) 1.8 - 8.0 K/UL    ABS. LYMPHOCYTES 1.4 0.9 - 3.6 K/UL    ABS. MONOCYTES 1.3 (H) 0.05 - 1.2 K/UL    ABS. EOSINOPHILS 0.3 0.0 - 0.4 K/UL    ABS. BASOPHILS 0.0 0.0 - 0.06 K/UL    DF AUTOMATED     METABOLIC PANEL, COMPREHENSIVE    Collection Time: 05/16/14  7:21 AM   Result Value Ref Range    Sodium 138 136 - 145 mmol/L    Potassium 3.3 (L) 3.5 - 5.5 mmol/L    Chloride 102 100 - 108 mmol/L     CO2 27 21 - 32 mmol/L    Anion gap 9 3.0 - 18 mmol/L    Glucose 149 (H) 74 - 99 mg/dL    BUN 9 7.0 - 18 MG/DL    Creatinine 1.19 0.6 - 1.3 MG/DL    BUN/Creatinine ratio 8 (L) 12 - 20      GFR est AA >60 >60 ml/min/1.66m2    GFR est non-AA >60 >60 ml/min/1.2m2    Calcium 8.2 (L) 8.5 - 10.1 MG/DL    Bilirubin, total 0.5 0.2 - 1.0 MG/DL    ALT 16 16 - 61 U/L    AST 21 15 - 37 U/L    Alk. phosphatase 72 45 - 117 U/L    Protein, total 7.5 6.4 - 8.2 g/dL    Albumin 3.0 (  L) 3.4 - 5.0 g/dL    Globulin 4.5 (H) 2.0 - 4.0 g/dL    A-G Ratio 0.7 (L) 0.8 - 1.7     MONONUCLEOSIS SCREEN    Collection Time: 05/16/14  7:21 AM   Result Value Ref Range    Mononucleosis screen NEGATIVE  NEG         MDM  Number of Diagnoses or Management Options  Diagnosis management comments: URI, strep, sinusitis, influenza, mono, bronchitis, PNA, asthma. No evidence of PTA, RPA, or ludwig's.        Amount and/or Complexity of Data Reviewed  Tests in the radiology section of CPT??: ordered and reviewed (CXR)  Independent visualization of images, tracings, or specimens: yes (CXR)      MEDICATIONS GIVEN:  Medications   0.9% sodium chloride infusion 1,000 mL (0 mL IntraVENous IV Completed 05/16/14 0844)   acetaminophen (TYLENOL) tablet 1,000 mg (1,000 mg Oral Given 05/16/14 0702)        Procedures    PROGRESS NOTE:  6:49 AM   Initial assessment performed.  Written by Suzi Roots, ED Scribe, as dictated by Smitty Knudsen, MD.    Afebrile. Non-toxic. No resp distress or acc muscle use. Lungs CTAB. No hypoxia. Asleep on reassessment. Feeling better. Labs reassuring. Mono, strep, and influenza neg. Possible early infiltrate in RLL. Will cover with ABX but suspect viral process. Antitussive. Short course prednisone with BS precautions and need for frequent BS checks/increase lantus reviewed. Rest. PO Fluids. FU with PCP. Reasons to RTED discussed with pt. All questions answered. Pt feels comfortable going home at this time. Pt  expressed understanding and he agrees with plan.      DISCHARGE NOTE:   8:51 AM   Esther Hardy Cali's results have been reviewed with patient and/or family. Patient and/or family has been counseled regarding diagnosis, treatment, and plan.  Patient and/or family verbally conveys understanding and agreement of the signs, symptoms, diagnosis, treatment and prognosis and additionally agrees to follow up as discussed.  Patient and/or family also agrees with the care-plan and conveys that all of his/her questions have been answered.  I have also provided discharge instructions for the patient and/or family that include: educational information regarding their diagnosis and treatment, and list of reasons why they would want to return to the ED prior to their follow-up appointment, should patient's condition change.     CLINICAL IMPRESSION    1. Acute bronchitis, unspecified organism           AFTER VISIT PLAN    Follow-up Information     Follow up With Details Comments Contact Info    Otho Perl, NP Call in 2 days  [address]  [place] [address]  [phone]      [facility] EMERGENCY DEPT  As needed, If symptoms worsen [address]  [place] [address]  [phone]          Current Discharge Medication List      START taking these medications    Details   predniSONE (DELTASONE) 20 mg tablet Take 2 Tabs by mouth daily for 5 days. Take with food. Will raise blood sugar.  Qty: 10 Tab, Refills: 0      levofloxacin (LEVAQUIN) 500 mg tablet Take 1 Tab by mouth daily.  Qty: 10 Tab, Refills: 0      guaiFENesin-codeine (ROBITUSSIN AC) 10-100 mg/5 mL solution Take 5 mL by mouth three (3) times daily as  needed for Cough. Max Daily Amount: 15 mL. Please do not drive while taking this medication.  Qty: 160 mL, Refills: 0         CONTINUE these medications which have NOT CHANGED    Details   insulin glargine (LANTUS) 100 unit/mL injection by SubCUTAneous route nightly.       levothyroxine (LEVOTHROID) 125 mcg tablet Take 250 mcg by mouth Daily (before breakfast).      carvedilol (COREG) 12.5 mg tablet Take 12.5 mg by mouth two (2) times daily (with meals).      amLODIPine-benazepril (LOTREL) 10-40 mg per capsule Take 1 Cap by mouth daily.      hydrochlorothiazide (HYDRODIURIL) 25 mg tablet Take 25 mg by mouth daily.      ibuprofen (MOTRIN) 600 mg tablet Take 1 Tab by mouth every six (6) hours as needed for Pain.  Qty: 20 Tab, Refills: 0      sildenafil citrate (VIAGRA) 100 mg tablet Take 100 mg by mouth as needed.             Written by Suzi Roots, ED Scribe, as dictated by Smitty Knudsen, MD.    I agree with the above documentation as written by the scribe.  - Luxe Cuadros, MD

## 2014-05-16 NOTE — ED Notes (Signed)
Report to Judi SaaLiz Edwards, RN

## 2014-05-16 NOTE — ED Notes (Signed)
Patient armband removed and shredded    I have reviewed discharge instructions with the patient.  The patient verbalized understanding.

## 2014-05-18 LAB — STREP THROAT SCREEN: Strep Screen: NEGATIVE

## 2014-08-27 ENCOUNTER — Ambulatory Visit (INDEPENDENT_AMBULATORY_CARE_PROVIDER_SITE_OTHER): Admitting: Family Medicine

## 2014-08-27 VITALS — BP 120/80 | HR 82 | Temp 98.7°F | Resp 16 | Ht 72.0 in | Wt 292.0 lb

## 2014-08-27 DIAGNOSIS — E119 Type 2 diabetes mellitus without complications: Secondary | ICD-10-CM

## 2014-08-27 MED ORDER — INSULIN GLARGINE 100 UNIT/ML ~~LOC~~ SOLN: 100.0000 [IU] | Freq: Every day | SUBCUTANEOUS | Status: AC

## 2014-08-27 NOTE — Progress Notes (Signed)
Subjective: [place]abetic [place]. He has a Dr. up that way. He does have an apartment down here. He is working on digit. He is out of his Lantus insulin and needs a prescription. His last A1c was 7. something about 6 weeks ago. He denies any palpitations of his diabetes. He is not following his diet very strictly. He does not get regular exercise much.  Objective: Not reexamined her testing today  Assessment: Type 2 diabetes mellitus, insulin controlled  Plan: Refill his medications No known that he is welcome to come here whenever necessary while [place].

## 2014-08-27 NOTE — Patient Instructions (Signed)
Continue insulin per prior routine  Return if needed

## 2014-10-21 ENCOUNTER — Emergency Department (HOSPITAL_COMMUNITY)

## 2014-10-21 ENCOUNTER — Emergency Department (HOSPITAL_COMMUNITY)
Admission: EM | Admit: 2014-10-21 | Discharge: 2014-10-21 | Disposition: A | Discharge status: Home / Self Care | Attending: Emergency Medicine | Admitting: Emergency Medicine

## 2014-10-21 ENCOUNTER — Encounter (HOSPITAL_COMMUNITY): Payer: Self-pay | Admitting: *Deleted

## 2014-10-21 DIAGNOSIS — Z79899 Other long term (current) drug therapy: Secondary | ICD-10-CM | POA: Diagnosis not present

## 2014-10-21 DIAGNOSIS — Z794 Long term (current) use of insulin: Secondary | ICD-10-CM | POA: Diagnosis not present

## 2014-10-21 DIAGNOSIS — R52 Pain, unspecified: Secondary | ICD-10-CM

## 2014-10-21 DIAGNOSIS — M5412 Radiculopathy, cervical region: Secondary | ICD-10-CM | POA: Insufficient documentation

## 2014-10-21 DIAGNOSIS — M47812 Spondylosis without myelopathy or radiculopathy, cervical region: Secondary | ICD-10-CM

## 2014-10-21 DIAGNOSIS — M79602 Pain in left arm: Secondary | ICD-10-CM | POA: Diagnosis present

## 2014-10-21 DIAGNOSIS — E119 Type 2 diabetes mellitus without complications: Secondary | ICD-10-CM | POA: Diagnosis not present

## 2014-10-21 MED ORDER — OXYCODONE-ACETAMINOPHEN 5-325 MG PO TABS: 1.0000 | ORAL_TABLET | ORAL | Status: DC | PRN

## 2014-10-21 MED ORDER — DIAZEPAM 10 MG PO TABS: 10.0000 mg | ORAL_TABLET | Freq: Four times a day (QID) | ORAL | Status: DC | PRN

## 2014-10-21 MED ORDER — PREDNISONE 10 MG PO TABS: ORAL_TABLET | ORAL | Status: DC

## 2014-10-21 NOTE — ED Notes (Signed)
Pt reports L shoulder, L side neck, L arm pain x 1 week. Pt reports having a massage done and changing his mattress without relief.  Pt also reports pain is worse when turning his head to the Left.  Denies any heart problems, dizziness, or SOB at this time.

## 2014-10-21 NOTE — ED Provider Notes (Signed)
CSN: [id number]     Arrival date & time 10/21/14  1535 History  This chart was scribed for Mancel Bale, MD by Chestine Spore, ED Scribe. The patient was seen in room WTR7/WTR7 at 4:07 PM.     Chief Complaint  Patient presents with  . Arm Pain      The history is provided by the patient. No language interpreter was used.    HPI Comments: David Oconnor is a 59 y.o. male with a medical hx of DM who presents to the Emergency Department complaining of left arm pain onset 1 week. Pt notes that his left arm pain goes from his left shoulder, left neck pain, and into his left front chest. Pt reports that he is trying to stretch the muscles to decrease his pain. Pt notes that he is having pain with breathing in and that is new for him. Pt recently changed mattresses due to his pain and that has not alleviated his symptoms. 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 He states that he has tried massage therapy with no relief for his symptoms. Pt was informed by the masseuse to come into the ED. He denies falls, neck injury, SOB, and any other symptoms. Pt has sleep apnea and he has not been using his machine lately. Pt moved to [place] from [place]. Pt is left hand dominant. Pt has had two MRI's in the past with one of them being of his right knee. Pt is unsure of the other location. There are no other modifying factors.   Past Medical History  Diagnosis Date  . Diabetes mellitus without complication    Past Surgical History  Procedure Laterality Date  . Appendectomy     No family history on file. History  Substance Use Topics  . Smoking status: Never Smoker   . Smokeless tobacco: Not on file  . Alcohol Use: 0.0 oz/week    0 Standard drinks or equivalent per week     Comment: occa    Review of Systems  Respiratory: Negative for shortness of breath.   Musculoskeletal: Positive for arthralgias and neck pain.  Neurological: Negative for numbness.  All  other systems reviewed and are negative.     Allergies  Review of patient's allergies indicates no known allergies.  Home Medications   Prior to Admission medications   Medication Sig Start Date End Date Taking? Authorizing Provider  insulin glargine (LANTUS) 100 UNIT/ML injection Inject 1 mL (100 Units total) into the skin at bedtime. Patient taking differently: Inject 100 Units into the skin daily.  08/27/14  Yes Peyton Najjar, MD  levothyroxine (SYNTHROID, LEVOTHROID) 50 MCG tablet Take 50 mcg by mouth daily before breakfast.   Yes Historical Provider, MD  diazepam (VALIUM) 10 MG tablet Take 1 tablet (10 mg total) by mouth every 6 (six) hours as needed (muscle spasm). 10/21/14   Mancel Bale, MD  oxyCODONE-acetaminophen (ROXICET) 5-325 MG per tablet Take 1 tablet by mouth every 4 (four) hours as needed for severe pain. 10/21/14   Mancel Bale, MD  predniSONE (DELTASONE) 10 MG tablet Take q day 6,5,4,3,2,1 10/21/14   Mancel Bale, MD   BP 123/69 mmHg  Pulse 84  Temp(Src) 98.7 F (37.1 C) (Oral)  Resp 16  SpO2 96% Physical Exam  Constitutional: He is oriented to person, place, and time. He appears well-developed and well-nourished.  HENT:  Head: Normocephalic and atraumatic.  Right Ear: External ear normal.  Left  Ear: External ear normal.  Eyes: Conjunctivae and EOM are normal. Pupils are equal, round, and reactive to light.  Neck: Normal range of motion and phonation normal. Neck supple.  Cardiovascular: Normal rate, regular rhythm and normal heart sounds.   Pulmonary/Chest: Effort normal and breath sounds normal. He exhibits no bony tenderness.  Musculoskeletal: Normal range of motion.  Neurological: He is alert and oriented to person, place, and time. No cranial nerve deficit or sensory deficit. He exhibits normal muscle tone. Coordination normal.  Skin: Skin is warm, dry and intact.  Psychiatric: He has a normal mood and affect. His behavior is normal. Judgment and thought  content normal.  Nursing note and vitals reviewed.   ED Course  Procedures (including critical care time)  Filed Vitals:   10/21/14 1544 10/21/14 1828  BP: 123/69   Pulse: 92 84  Temp: 98.7 F (37.1 C)   TempSrc: Oral   Resp: 16   SpO2: 96% 96%    Meds ordered this encounter  Medications  . predniSONE (DELTASONE) 10 MG tablet    Sig: Take q day 6,5,4,3,2,1    Dispense:  21 tablet    Refill:  0  . diazepam (VALIUM) 10 MG tablet    Sig: Take 1 tablet (10 mg total) by mouth every 6 (six) hours as needed (muscle spasm).    Dispense:  15 tablet    Refill:  0  . oxyCODONE-acetaminophen (ROXICET) 5-325 MG per tablet    Sig: Take 1 tablet by mouth every 4 (four) hours as needed for severe pain.    Dispense:  20 tablet    Refill:  0      COORDINATION OF CARE: 4:19 PM-Discussed treatment plan which includes L-spine X-ray with pt at bedside and pt agreed to plan.      Labs Review Labs Reviewed - No data to display  Imaging Review Dg Cervical Spine Complete  10/21/2014   CLINICAL DATA:  Left-sided neck pain which radiates to the left shoulder for 2 weeks. No known injury.  EXAM: CERVICAL SPINE  4+ VIEWS  COMPARISON:  None.  FINDINGS: C1 to the superior endplate of C6 is imaged on the provided lateral radiograph though there is adequate evaluation of the C6-C7 as well as the cervical thoracic junction on the provided swimmer's radiograph.  Normal alignment of the cervical spine. No anterolisthesis or retrolisthesis. There is very mild leftward deviation the dens between the lateral masses of C1.  Cervical vertebral body heights are preserved. Prevertebral soft tissues are normal.  Mild multilevel cervical spine DDD, worst C5-C6 and C6-C7 with disc space height loss, endplate irregularity and sclerosis.  The bilateral neural foramina appear widely patent given obliquity.  Calcified atherosclerotic plaque overlies expected location the bilateral carotid bulbs. Regional soft tissues  appear otherwise normal. Limited visualization lung apices is normal.  IMPRESSION: 1. No acute findings. 2. Mild multilevel cervical spine DDD, worse at C5-C6 and C6-C7. 3. Calcified atherosclerotic plaque overlies expected location the bilateral carotid bulbs. Further evaluation with nonemergent carotid Doppler ultrasound could be performed as clinically indicated.   Electronically Signed   By: Simonne Come M.D.   On: 10/21/2014 17:31   Dg Lumbar Spine Complete  10/21/2014   CLINICAL DATA:  Neck and left arm pain. No lumbar spine symptoms given.  EXAM: LUMBAR SPINE - COMPLETE 4+ VIEW  COMPARISON:  None.  FINDINGS: Moderate-to-marked disc space narrowing with a vacuum phenomenon and mild to moderate anterior spur formation at the L5-S1 level. Mild  anterior spur formation at the L4-5 level. Facet degenerative changes at those levels. Normal alignment. No pars defects or fractures. Left upper quadrant abdominal surgical clips and staples.  IMPRESSION: Lower lumbar spine degenerative changes.  No acute abnormality.   Electronically Signed   By: Beckie Salts M.D.   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 Doubt spinal stenosis, fracture or spinal infection  Nursing Notes Reviewed/ Care Coordinated Applicable Imaging Reviewed Interpretation of Laboratory Data incorporated into ED treatment  The patient appears reasonably screened and/or stabilized for discharge and I doubt any other medical condition or other [facility] requiring further screening, evaluation, or treatment in the ED at this time prior to discharge.  Plan: Home Medications- Prednisone, Valium, Percocet; Home Treatments- Heat treatment; return here if the recommended treatment, does not improve the symptoms; Recommended follow up- PCP 1 week  I personally performed the  services described in this documentation, which was scribed in my presence. The recorded information has been reviewed and is accurate.     Mancel Bale, MD 10/22/14 [phone]

## 2014-10-21 NOTE — Discharge Instructions (Signed)
Cervical Radiculopathy °Cervical radiculopathy happens when a nerve in the neck is pinched or bruised by a slipped (herniated) disk or by arthritic changes in the bones of the cervical spine. This can occur due to an injury or as part of the normal aging process. Pressure on the cervical nerves can cause pain or numbness that runs from your neck all the way down into your arm and fingers. °CAUSES  °There are many possible causes, including: °· Injury. °· Muscle tightness in the neck from overuse. °· Swollen, painful joints (arthritis). °· Breakdown or degeneration in the bones and joints of the spine (spondylosis) due to aging. °· Bone spurs that may develop near the cervical nerves. °SYMPTOMS  °Symptoms include pain, weakness, or numbness in the affected arm and hand. Pain can be severe or irritating. Symptoms may be worse when extending or turning the neck. °DIAGNOSIS  °Your caregiver will ask about your symptoms and do a physical exam. He or she may test your strength and reflexes. X-rays, CT scans, and MRI scans may be needed in cases of injury or if the symptoms do not go away after a period of time. Electromyography (EMG) or nerve conduction testing may be done to study how your nerves and muscles are working. °TREATMENT  °Your caregiver may recommend certain exercises to help relieve your symptoms. Cervical radiculopathy can, and often does, get better with time and treatment. If your problems continue, treatment options may include: °· Wearing a soft collar for short periods of time. °· Physical therapy to strengthen the neck muscles. °· Medicines, such as nonsteroidal anti-inflammatory drugs (NSAIDs), oral corticosteroids, or spinal injections. °· Surgery. Different types of surgery may be done depending on the cause of your problems. °HOME CARE INSTRUCTIONS  °· Put ice on the affected area. °¨ Put ice in a plastic bag. °¨ Place a towel between your skin and the bag. °¨ Leave the ice on for 15-20 minutes,  03-04 times a day or as directed by your caregiver. °· If ice does not help, you can try using heat. Take a warm shower or bath, or use a hot water bottle as directed by your caregiver. °· You may try a gentle neck and shoulder massage. °· Use a flat pillow when you sleep. °· Only take over-the-counter or prescription medicines for pain, discomfort, or fever as directed by your caregiver. °· If physical therapy was prescribed, follow your caregiver's directions. °· If a soft collar was prescribed, use it as directed. °SEEK IMMEDIATE MEDICAL CARE IF:  °· Your pain gets much worse and cannot be controlled with medicines. °· You have weakness or numbness in your hand, arm, face, or leg. °· You have a high fever or a stiff, rigid neck. °· You lose bowel or bladder control (incontinence). °· You have trouble with walking, balance, or speaking. °MAKE SURE YOU:  °· Understand these instructions. °· Will watch your condition. °· Will get help right away if you are not doing well or get worse. °Document Released: 12/12/2000 Document Revised: 06/11/2011 Document Reviewed: 10/31/2010 °ExitCare® Patient Information ©2015 [employer]. This information is not intended to replace advice given to you by your health care provider. Make sure you discuss any questions you have with your health care provider. ° ° ° °Emergency Department Resource Guide °1) Find a Doctor and Pay Out of Pocket °Although you won't have to find out who is covered by your insurance plan, it is a good idea to ask around and get recommendations. You   will then need to call the office and see if the doctor you have chosen will accept you as a new patient and what types of options they offer for patients who are self-pay. Some doctors offer discounts or will set up payment plans for their patients who do not have insurance, but you will need to ask so you aren't surprised when you get to your appointment. ° °2) Contact Your Local Health Department °Not all  health departments have doctors that can see patients for sick visits, but many do, so it is worth a call to see if yours does. If you don't know where your local health department is, you can check in your phone book. The CDC also has a tool to help you locate your state's health department, and many state websites also have listings of all of their local health departments. ° °3) Find a Walk-in Clinic °If your illness is not likely to be very severe or complicated, you may want to try a walk in clinic. These are popping up all over the country in pharmacies, drugstores, and shopping centers. They're usually staffed by nurse practitioners or physician assistants that have been trained to treat common illnesses and complaints. They're usually fairly quick and inexpensive. However, if you have serious medical issues or chronic medical problems, these are probably not your best option. ° °No Primary Care Doctor: °- Call [facility] at  832-8000 - they can help you locate a primary care doctor that  accepts your insurance, provides certain services, etc. °- Physician Referral Service- 1-800-533-3463 ° °Chronic Pain Problems: °Organization         Address  Phone   Notes  °[facility]  (336) 297-2271 Patients need to be referred by their primary care doctor.  ° °Medication Assistance: °Organization         Address  Phone   Notes  °[place] [facility] [address] °[place], [place] [address] (336) 641-8030 --Must be a resident of [place] °-- Must have NO insurance coverage whatsoever (no Medicaid/ Medicare, etc.) °-- The pt. MUST have a primary care doctor that directs their care regularly and follows them in the community °  °[facility]  (866) 331-1348   °[employer]  (888) 892-1162   ° °Agencies that provide inexpensive medical care: °Organization         Address  Phone   Notes  °[facility]  (336) 832-8035   °[facility]     (336) 832-7272   °[facility] [address] °[place], [place] [address] (336) 832-4777   °[facility] [address], °[place] (336) 271-4999   °[facility]    (336) 373-0678   °[facility]    (336) 272-1050   °[facility] ° [address], [place] Phone:  (336) 832-4444, Fax:  (336) 832-4440 Hours of Operation:  9 am - 6 pm, M-F.  Also accepts Medicaid/Medicare and self-pay.  °[facility] ° [address], [place] Phone: (336) 832-3150, Fax: (336) 832-3151. Hours of Operation:  8:30 am - 5:30 pm, M-F.  Also accepts Medicaid and self-pay.  °[facility] [place] [address], [place] Phone: (336) 878-6027   °[facility] [address], [place], [place] (336)723-1848, Ext. 123 Mondays & Thursdays: 7-9 AM.  First 15 patients are seen on a first come, first   serve basis. °  ° °Medicaid-accepting [place] Providers: ° °Organization         Address  Phone   Notes  °[facility] [address], [place] (336) 641-2100 Also accepts self-pay patients.  °[facility] [address], [place] ° (336) 856-9996   °[facility] [address], [place] (336) 288-8857   °[facility] [address] [place] [address], [place] (336) 299-7000   °Veita Bland [address], [place]  ° (336) 373-1557 Only accepts [place] Access Medicaid patients after they have their name applied to their card.  ° °Self-Pay (no insurance) in [place]: ° °Organization         Address  Phone   Notes  °Sickle Cell Patients, [facility] [address], [place] (336) 832-1970   °[facility] [address], [place] (336) 832-4400   °[facility] ° [address], [place] (336) 992-4800     °[facility]/Dr. Osei-Bonsu ° [address] [place] [address], [place] or [address], [place] (336) 841-8500 Phone number for both [place] and [place] locations is the same.  °[facility] [address], [place] (336) 299-0000   °[facility] [address] [place] [address], [place] or [address] (336) 852-7530 °(336) 878-2260   °[facility] [address], [place] (336) 350-1642, phone; (336) 294-5005, fax Sees patients 1st and 3rd Saturday of every month.  Must not qualify for public or private insurance (i.e. Medicaid, Medicare, [place] Health Choice, Veterans' Benefits) • Household income should be no more than 200% of the poverty level •The clinic cannot treat you if you are pregnant or think you are pregnant • Sexually transmitted diseases are not treated at the clinic.  ° ° °Dental Care: °Organization         Address  Phone  Notes  °[facility] [address], [place] (336) 641-6152 Accepts children up to age 21 who are enrolled in Medicaid or [place] Health Choice; pregnant women with a Medicaid card; and children who have applied for Medicaid or [place] Health Choice, but were declined, whose parents can pay a reduced fee at time of service.  °[facility] [place]  [address], [place] (336) 641-7733 Accepts children up to age 21 who are enrolled in Medicaid or [place] Health Choice; pregnant women with a Medicaid card; and children who have applied for Medicaid or [place] Health Choice, but were declined, whose parents can pay a reduced fee at time of service.  °[facility] ° [address],  (336) 641-4533 Patients are seen by appointment only. Walk-ins are not accepted. [facility] will see patients 18 years of age and older. °Monday - Tuesday (8am-5pm) °Most Wednesdays (8:30-5pm) °$30 per visit,  cash only  °[facility] ° [address], [place] (336) 641-4533 Patients are seen by appointment only. Walk-ins are not accepted. [facility] will see patients 18 years of age and older. °One Wednesday Evening (Monthly: Volunteer Based).  $30 per visit, cash only  °[facility]  (919) 537-3737 for adults; Children under age 4, call Graduate Pediatric Dentistry at ([phone]) [phone]. Children aged 4-14, please   call (919) [phone] to request a pediatric application. ° Dental services are provided in all areas of dental care including fillings, crowns and bridges, complete and partial dentures, implants, gum treatment, root canals, and extractions. Preventive care is also provided. Treatment is provided to both adults and children. °Patients are selected via a lottery and there is often a waiting list. °  °[facility] [address], °[place] ° (336) 763-8833 [email or web address] °  °[facility] [address], [place], [place] (336)723-1848, Ext. 123 Second and Fourth Thursday of each month, opens at 6:30 AM; Clinic ends at 9 AM.  Patients are seen on a first-come first-served basis, and a limited number are seen during each clinic.  ° °[facility] ° [address], [place], [place] (336) 723-7904   Eligibility Requirements °You must have lived in [place], [place], or [place] counties for at least the last three months. °  You cannot be eligible for state or federal sponsored healthcare insurance, including Veterans Administration, Medicaid, or Medicare. °  You generally cannot be eligible for healthcare insurance through your employer.  °  How to apply: °Eligibility screenings are held every Tuesday and Wednesday afternoon from 1:00 pm until 4:00 pm. You do not need an appointment for the interview!  °[facility] [address], [place], [place] 336-631-2330   °[facility]   336-342-8273   °[facility]  336-703-3100   °[facility]  336-570-6415   ° °Behavioral Health Resources in the Community: °Intensive Outpatient Programs °Organization         Address  Phone  Notes  °[facility] [address], [place], [place] 336-878-6098   °[facility] [address], [place], [place] 336-832-9800   °[facility] [address], [place], [place] ° 336-882-2125   °[facility] [address],  °[place], [place] 1-800-853-5163 or 336-641-4981   °Substance Abuse Resources °Organization         Address  Phone  Notes  °[facility]  336-882-2125   °[facility]  336-784-9470   °[facility]  336-285-9073   °[facility]  336-845-3988   °Residential & Outpatient Substance Abuse Program  1-800-659-3381   °Psychological Services °Organization         Address  Phone  Notes  °[facility]  336- 832-9600   °[facility]  336- 378-7881   °[facility] [address], [place] 1-800-853-5163 or 336-641-4981   ° °Mobile Crisis Teams °Organization         Address  Phone  Notes  °[facility], Mobile Crisis Care Unit  1-877-626-1772   °Assertive °[facility] ° [address] [place], [place] 336-834-9664   °Sharon DeEsch [address] °[place] 336-554-5454   ° °Self-Help/Support Groups °Organization         Address  Phone             Notes  °[facility] - variety of support groups  336- 373-1402 Call for more information  °Narcotics Anonymous (NA), [facility] [address], °[place]   2 meetings at this location  ° °Residential Treatment Programs °Organization         Address  Phone  Notes  °[facility] [address],    °[place]   1-866-801-8205   °[facility] °   [address], [place], [place] 704-293-8524    °[facility] [address], [place] 336-845-3988 Admissions: 8am-3pm M-F  °[facility] [address]    °[place], [place] 336-841-1104   °[facility] [address], [place], [place] 336-379-7146   °[facility] [address]  °[place], [place] 336-285-9073   °[facility] Intensive Outpatient [address], [place], [place] 336-852-3033   °[facility] [address]  °[place], [place] 1-877-615-2722 or 336-784-9470   °[facility] [address] [place], [place] 336-227-7417 Accepts Medicaid  °[facility] [address]  °[place] 1-800-659-3381 Substance Abuse/Addiction Treatment  ° °[place] Behavioral Health Resources °Organization         Address  Phone  Notes  °[facility]  (888) 581-9988   °Julie Brannon, PhD [address] [place], [place]   (336) 349-5553 or (336) 951-0000   °[facility]   [address] °[place], [place], [place] (336) 349-4454   °[facility] [address], [place], [place] (336) 342-8316 Insurance/Medicaid/sponsorship through [facility]  °[facility] [address]                                    [place], [place] (336) 342-8316 Therapy/tele-psych/case  °[facility] [address]  ° [place], [place] (336) 349-2233    °Dr. Arfeen  (336) 349-4544   °[facility] [place]  [employer] [place] [facility] 1) [address],  °2) [address], [place] °3)  [address], [place] (336) 349-3220 °(336) 342-7768 ° °(336) 342-8140   °[place] [facility] (336) 342-1394 or (336) 342-3537 (After Hours)    ° ° ° °

## 2014-11-03 ENCOUNTER — Emergency Department (HOSPITAL_COMMUNITY)
Admission: EM | Admit: 2014-11-03 | Discharge: 2014-11-03 | Disposition: A | Discharge status: Home / Self Care | Attending: Emergency Medicine | Admitting: Emergency Medicine

## 2014-11-03 ENCOUNTER — Encounter (HOSPITAL_COMMUNITY): Payer: Self-pay | Admitting: Emergency Medicine

## 2014-11-03 ENCOUNTER — Emergency Department (HOSPITAL_COMMUNITY)

## 2014-11-03 DIAGNOSIS — H9202 Otalgia, left ear: Secondary | ICD-10-CM | POA: Insufficient documentation

## 2014-11-03 DIAGNOSIS — I10 Essential (primary) hypertension: Secondary | ICD-10-CM | POA: Insufficient documentation

## 2014-11-03 DIAGNOSIS — R51 Headache: Secondary | ICD-10-CM | POA: Diagnosis not present

## 2014-11-03 DIAGNOSIS — Z79899 Other long term (current) drug therapy: Secondary | ICD-10-CM | POA: Insufficient documentation

## 2014-11-03 DIAGNOSIS — M542 Cervicalgia: Secondary | ICD-10-CM | POA: Diagnosis present

## 2014-11-03 DIAGNOSIS — E119 Type 2 diabetes mellitus without complications: Secondary | ICD-10-CM | POA: Diagnosis not present

## 2014-11-03 DIAGNOSIS — R519 Headache, unspecified: Secondary | ICD-10-CM

## 2014-11-03 LAB — CBC WITH DIFFERENTIAL/PLATELET
Basophils Absolute: 0.1 10*3/uL (ref 0.0–0.1)
Basophils Relative: 1 % (ref 0–1)
EOS ABS: 0.4 10*3/uL (ref 0.0–0.7)
EOS PCT: 5 % (ref 0–5)
HEMATOCRIT: 32.2 % — AB (ref 39.0–52.0)
HEMOGLOBIN: 10 g/dL — AB (ref 13.0–17.0)
Lymphocytes Relative: 22 % (ref 12–46)
Lymphs Abs: 1.9 10*3/uL (ref 0.7–4.0)
MCH: 24.4 pg — ABNORMAL LOW (ref 26.0–34.0)
MCHC: 31.1 g/dL (ref 30.0–36.0)
MCV: 78.5 fL (ref 78.0–100.0)
MONOS PCT: 8 % (ref 3–12)
Monocytes Absolute: 0.7 10*3/uL (ref 0.1–1.0)
NEUTROS ABS: 5.5 10*3/uL (ref 1.7–7.7)
NEUTROS PCT: 64 % (ref 43–77)
PLATELETS: 295 10*3/uL (ref 150–400)
RBC: 4.1 MIL/uL — AB (ref 4.22–5.81)
RDW: 16.2 % — AB (ref 11.5–15.5)
WBC: 8.5 10*3/uL (ref 4.0–10.5)

## 2014-11-03 LAB — SEDIMENTATION RATE: Sed Rate: 21 mm/hr — ABNORMAL HIGH (ref 0–16)

## 2014-11-03 LAB — BASIC METABOLIC PANEL
ANION GAP: 10 (ref 5–15)
BUN: 15 mg/dL (ref 6–20)
CO2: 25 mmol/L (ref 22–32)
Calcium: 8.5 mg/dL — ABNORMAL LOW (ref 8.9–10.3)
Chloride: 105 mmol/L (ref 101–111)
Creatinine, Ser: 1.27 mg/dL — ABNORMAL HIGH (ref 0.61–1.24)
GFR calc non Af Amer: 59 mL/min — ABNORMAL LOW (ref >60)
Glucose, Bld: 105 mg/dL — ABNORMAL HIGH (ref 65–99)
POTASSIUM: 3.4 mmol/L — AB (ref 3.5–5.1)
Sodium: 140 mmol/L (ref 135–145)

## 2014-11-03 LAB — C-REACTIVE PROTEIN: CRP: <0.5 mg/dL (ref <1.0)

## 2014-11-03 NOTE — ED Notes (Signed)
Per pt, states he was at work when his left ear pain radiating to neck and head-states he was having difficulty swallowing and speaking

## 2014-11-03 NOTE — ED Provider Notes (Signed)
CSN: [id number]     Arrival date & time 11/03/14  1021 History   First MD Initiated Contact with Patient 11/03/14 1045     Chief Complaint  Patient presents with  . Otalgia  . head/neck pain     (Consider location/radiation/quality/duration/timing/severity/associated sxs/prior Treatment)  HPI 59 year old male who presents with headache and otalgia. He has a history of HTN and DM. He has no prior headache history. Just started working night shifts at his new job one month ago and describes having many problems since then including difficulty sleeping and occasional aches and pain everywhere. Last night while at work, reports developing a sharp ear ache that radiated towards the top of his head in a dime sized pin point location. This occurred at around 2-3 AM. Pain was throbbing, intermittent, and 7/10 in severity. He had difficulty sleeping when he went home today, and then came to ED for evaluation. Reports that on arrival, he did not have pain, but randomly he would develop a throbbing pain that came from the anterior aspect of his left ear to the top of his head. He noticed that with one episode, it came on while he is leaning forward. Does not notice it when he coughs or bears down, not associated with position otherwise. Denies nausea, vomiting, vision changes, speech changes, focal weakness or numbness. Noticed that pain seem to be more severe while he was chewing his food during his lunch/dinner break during work. He was seen in the ED in mid July for evaluation of left sided neck pain and shoulder, which he reports is improving and does not seem associated with his symptoms today. He has not had fever, chills, night sweats, weight changes. Denies significant family history of aneurysms, sudden death, or neurological problems. Denies odynophagia, dysphagia, oropharyngeal swelling, difficulty breathing or handling secretions.  Past Medical History  Diagnosis Date  . Diabetes mellitus without  complication    Past Surgical History  Procedure Laterality Date  . Appendectomy     History reviewed. No pertinent family history. History  Substance Use Topics  . Smoking status: Never Smoker   . Smokeless tobacco: Not on file  . Alcohol Use: 0.0 oz/week    0 Standard drinks or equivalent per week     Comment: occa    Review of Systems 10/14 systems reviewed and are negative other than those stated in the HPI   Allergies  Review of patient's allergies indicates no known allergies.  Home Medications   Prior to Admission medications   Medication Sig Start Date End Date Taking? Authorizing Provider  amLODipine-benazepril (LOTREL) 10-40 MG per capsule Take 1 capsule by mouth daily.   Yes Historical Provider, MD  atorvastatin (LIPITOR) 20 MG tablet Take 20 mg by mouth daily.   Yes Historical Provider, MD  carvedilol (COREG) 6.25 MG tablet Take 6.25 mg by mouth daily.   Yes Historical Provider, MD  diazepam (VALIUM) 10 MG tablet Take 1 tablet (10 mg total) by mouth every 6 (six) hours as needed (muscle spasm). 10/21/14  Yes Daleen Bo, MD  glipiZIDE (GLUCOTROL XL) 10 MG 24 hr tablet Take 10 mg by mouth 2 (two) times daily.   Yes Historical Provider, MD  hydrochlorothiazide (HYDRODIURIL) 25 MG tablet Take 25 mg by mouth daily.   Yes Historical Provider, MD  insulin glargine (LANTUS) 100 UNIT/ML injection Inject 1 mL (100 Units total) into the skin at bedtime. Patient taking differently: Inject 100 Units into the skin daily.  08/27/14  Yes Posey Boyer, MD  levothyroxine (SYNTHROID, LEVOTHROID) 200 MCG tablet Take 200 mcg by mouth daily.   Yes Historical Provider, MD  oxyCODONE-acetaminophen (ROXICET) 5-325 MG per tablet Take 1 tablet by mouth every 4 (four) hours as needed for severe pain. Patient not taking: Reported on 11/03/2014 10/21/14   Daleen Bo, MD  predniSONE (DELTASONE) 10 MG tablet Take q day 6,5,4,3,2,1 Patient not taking: Reported on 11/03/2014 10/21/14   Daleen Bo, MD   BP 127/72 mmHg  Pulse 77  Temp(Src) 97.5 F (36.4 C) (Oral)  Resp 20  SpO2 100% Physical Exam  Nursing note and vitals reviewed.  Physical Exam  Constitutional: Well developed, well nourished, non-toxic, and in no acute distress. Comfortably conversational.  Head: Normocephalic and atraumatic. No tenderness over temples over temporal arteries.  Ear: Normal externally. Bilateral TMs normal. No mastoid tenderness. Mouth/Throat: Oropharynx is clear and moist.  Neck: Normal range of motion. Neck supple. No meningismus.  Cardiovascular: Normal rate and regular rhythm.   Pulmonary/Chest: Effort normal and breath sounds normal.  Abdominal: Soft. There is no tenderness. There is no rebound and no guarding.  Musculoskeletal: Normal range of motion.  Neurological:  Alert, oriented to person, place, time, and situation. Memory grossly in tact. Fluent speech. No dysarthria or aphasia.  Cranial nerves: VF are full. Fundoscopic exam-unable to get good visualization of the discs. Pupils are symmetric, and reactive to light. EOMI without nystagmus. No gaze deviation. Facial muscles symmetric with activation. Sensation to light touch over face in tact bilaterally. Hearing grossly in tact. Palate elevates symmetrically. Head turn and shoulder shrug are intact. Tongue midline.  Reflexes defered.  Muscle bulk and tone normal. No pronator drift. Moves all extremities symmetrically. Sensation to light touch is in tact throughout in bilateral upper and lower extremities. Coordination reveals no dysmetria with finger to nose. Gait is narrow-based and steady. Non-ataxic. Skin: Skin is warm and dry.  Psychiatric: Cooperative  ED Course  Procedures (including critical care time) Labs Review Labs Reviewed  CBC WITH DIFFERENTIAL/PLATELET - Abnormal; Notable for the following:    RBC 4.10 (*)    Hemoglobin 10.0 (*)    HCT 32.2 (*)    MCH 24.4 (*)    RDW 16.2 (*)    All other components within  normal limits  BASIC METABOLIC PANEL - Abnormal; Notable for the following:    Potassium 3.4 (*)    Glucose, Bld 105 (*)    Creatinine, Ser 1.27 (*)    Calcium 8.5 (*)    GFR calc non Af Amer 59 (*)    All other components within normal limits  SEDIMENTATION RATE - Abnormal; Notable for the following:    Sed Rate 21 (*)    All other components within normal limits  C-REACTIVE PROTEIN    Imaging Review Ct Head Wo Contrast  11/03/2014   CLINICAL DATA:  Left ear pain, head pain  EXAM: CT HEAD WITHOUT CONTRAST  TECHNIQUE: Contiguous axial images were obtained from the base of the skull through the vertex without intravenous contrast.  COMPARISON:  None.  FINDINGS: No skull fracture is noted. Paranasal sinuses and mastoid air cells are unremarkable.  No intracranial hemorrhage, mass effect or midline shift. Mild cerebral atrophy. No acute cortical infarction. No mass lesion is noted on this unenhanced scan. The gray and white-matter differentiation is preserved. No hydrocephalus.  IMPRESSION: No acute intracranial abnormality.  Mild cerebral atrophy.   Electronically Signed   By: Orlean Bradford.D.  On: 11/03/2014 12:46     EKG Interpretation None      MDM   Final diagnoses:  Acute nonintractable headache, unspecified headache type    In short, this is a 59 year old male who presents with intermittent headache and left ear otalgia. He is well appearing, pleasant, and in no acute distress on presentation. His vital signs are within normal limits. He on presentation did not have any pain, but during ED stay, he did notice two episodes where he had episodic pain involving his left ear to the top of his head that lasted for seconds before fully resolved. His neurological exam is in tact. His head and neck exam was also unremarkable, and symptoms could not be reproduced. Ear exam unremarkable and no mastoid tenderness to suggest mastoiditis. No infectious symptoms and normal ROM of neck, less  concerning for meningitis. Headache does not seem sudden onset maximal intensity thunderclap headache to suggest SAH. No temporal artery pain or B symptoms to suggest GCA. CT head performed given age. This is visualized and reveals no acute intracranial processes. Basic blood work reveals unremarkable CBC and BMP. ESR mildly elevated and normal CRP, but again, exam and presentation not concerning for GCA. At this time unclear of the etiology of his pain, but I do not suspect life threatening etiologies at this time. We discussed close follow-up with his PCP this week to discuss further work-up, potentially additional outpatient imaging, and re-evaluation. We discussed that if he were to develop new symptoms (especially neurological), including vomiting,  or worsening or more frequent pain, that he will return to ED for re-evaluation. He expressed understanding of all discharge instructions and felt comfortable with the plan of care.      Forde Dandy, MD 11/03/14 2152

## 2014-11-03 NOTE — Discharge Instructions (Signed)
Return without fail for worsening symptoms, including worsening pain, more frequent episodes of pain, vision changes, speech changes, new numbness or weakness, confusion, fever, or any other symptoms concerning to you.   General Headache Without Cause A general headache is pain or discomfort felt around the head or neck area. The cause may not be found.  HOME CARE   Keep all doctor visits.  Only take medicines as told by your doctor.  Lie down in a dark, quiet room when you have a headache.  Keep a journal to find out if certain things bring on headaches. For example, write down:  What you eat and drink.  How much sleep you get.  Any change to your diet or medicines.  Relax by getting a massage or doing other relaxing activities.  Put ice or heat packs on the head and neck area as told by your doctor.  Lessen stress.  Sit up straight. Do not tighten (tense) your muscles.  Quit smoking if you smoke.  Lessen how much alcohol you drink.  Lessen how much caffeine you drink, or stop drinking caffeine.  Eat and sleep on a regular schedule.  Get 7 to 9 hours of sleep, or as told by your doctor.  Keep lights dim if bright lights bother you or make your headaches worse. GET HELP RIGHT AWAY IF:   Your headache becomes really bad.  You have a fever.  You have a stiff neck.  You have trouble seeing.  Your muscles are weak, or you lose muscle control.  You lose your balance or have trouble walking.  You feel like you will pass out (faint), or you pass out.  You have really bad symptoms that are different than your first symptoms.  You have problems with the medicines given to you by your doctor.  Your medicines do not work.  Your headache feels different than the other headaches.  You feel sick to your stomach (nauseous) or throw up (vomit). MAKE SURE YOU:   Understand these instructions.  Will watch your condition.  Will get help right away if you are not  doing well or get worse. Document Released: 12/27/2007 Document Revised: 06/11/2011 Document Reviewed: 03/09/2011 [facility] Patient Information 2015 [place], [place]. This information is not intended to replace advice given to you by your health care provider. Make sure you discuss any questions you have with your health care provider.

## 2014-11-03 NOTE — ED Notes (Signed)
He tells me he had a left earache yesterday evening which gradually worsened in intensity and in area involved.  It spread to periauricular area and to top of head areas; and also he has noted a "numb place" at the left back of his tongue.  He is in no distress and also tells [place] he was recently seen here and treated for a radiculopathy which involved his left upper extremity.

## 2014-11-03 NOTE — Progress Notes (Addendum)
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

## 2014-11-29 ENCOUNTER — Encounter (HOSPITAL_COMMUNITY): Payer: Self-pay | Admitting: Vascular Surgery

## 2014-11-29 ENCOUNTER — Emergency Department (HOSPITAL_COMMUNITY)
Admission: EM | Admit: 2014-11-29 | Discharge: 2014-11-29 | Disposition: A | Discharge status: Home / Self Care | Attending: Emergency Medicine | Admitting: Emergency Medicine

## 2014-11-29 DIAGNOSIS — E119 Type 2 diabetes mellitus without complications: Secondary | ICD-10-CM | POA: Insufficient documentation

## 2014-11-29 DIAGNOSIS — Z79899 Other long term (current) drug therapy: Secondary | ICD-10-CM | POA: Insufficient documentation

## 2014-11-29 DIAGNOSIS — Z794 Long term (current) use of insulin: Secondary | ICD-10-CM | POA: Insufficient documentation

## 2014-11-29 DIAGNOSIS — R2243 Localized swelling, mass and lump, lower limb, bilateral: Secondary | ICD-10-CM | POA: Diagnosis present

## 2014-11-29 DIAGNOSIS — M542 Cervicalgia: Secondary | ICD-10-CM | POA: Insufficient documentation

## 2014-11-29 DIAGNOSIS — M7989 Other specified soft tissue disorders: Secondary | ICD-10-CM

## 2014-11-29 LAB — COMPREHENSIVE METABOLIC PANEL
ALK PHOS: 65 U/L (ref 38–126)
ALT: 29 U/L (ref 17–63)
ANION GAP: 10 (ref 5–15)
AST: 41 U/L (ref 15–41)
Albumin: 4 g/dL (ref 3.5–5.0)
BUN: 12 mg/dL (ref 6–20)
CO2: 26 mmol/L (ref 22–32)
Calcium: 7.8 mg/dL — ABNORMAL LOW (ref 8.9–10.3)
Chloride: 102 mmol/L (ref 101–111)
Creatinine, Ser: 1.31 mg/dL — ABNORMAL HIGH (ref 0.61–1.24)
GFR calc Af Amer: >60 mL/min (ref >60)
GFR calc non Af Amer: 57 mL/min — ABNORMAL LOW (ref >60)
GLUCOSE: 78 mg/dL (ref 65–99)
Potassium: 3.4 mmol/L — ABNORMAL LOW (ref 3.5–5.1)
SODIUM: 138 mmol/L (ref 135–145)
Total Bilirubin: 0.4 mg/dL (ref 0.3–1.2)
Total Protein: 6.9 g/dL (ref 6.5–8.1)

## 2014-11-29 LAB — CBC WITH DIFFERENTIAL/PLATELET
BASOS PCT: 1 % (ref 0–1)
Basophils Absolute: 0.1 10*3/uL (ref 0.0–0.1)
Eosinophils Absolute: 0.3 10*3/uL (ref 0.0–0.7)
Eosinophils Relative: 3 % (ref 0–5)
HCT: 34 % — ABNORMAL LOW (ref 39.0–52.0)
HEMOGLOBIN: 10.3 g/dL — AB (ref 13.0–17.0)
Lymphocytes Relative: 21 % (ref 12–46)
Lymphs Abs: 2.1 10*3/uL (ref 0.7–4.0)
MCH: 24 pg — ABNORMAL LOW (ref 26.0–34.0)
MCHC: 30.3 g/dL (ref 30.0–36.0)
MCV: 79.1 fL (ref 78.0–100.0)
Monocytes Absolute: 0.5 10*3/uL (ref 0.1–1.0)
Monocytes Relative: 5 % (ref 3–12)
NEUTROS ABS: 7.1 10*3/uL (ref 1.7–7.7)
NEUTROS PCT: 71 % (ref 43–77)
Platelets: 276 10*3/uL (ref 150–400)
RBC: 4.3 MIL/uL (ref 4.22–5.81)
RDW: 17.3 % — ABNORMAL HIGH (ref 11.5–15.5)
WBC: 10 10*3/uL (ref 4.0–10.5)

## 2014-11-29 MED ORDER — FUROSEMIDE 20 MG PO TABS: 20.0000 mg | ORAL_TABLET | Freq: Every day | ORAL | Status: DC

## 2014-11-29 MED ORDER — POTASSIUM CHLORIDE CRYS ER 20 MEQ PO TBCR
20.0000 meq | EXTENDED_RELEASE_TABLET | Freq: Once | ORAL | Status: AC
  Administered 2014-11-29: 20 meq via ORAL
  Filled 2014-11-29: qty 1

## 2014-11-29 MED ORDER — FUROSEMIDE 20 MG PO TABS
40.0000 mg | ORAL_TABLET | Freq: Once | ORAL | Status: AC
  Administered 2014-11-29: 40 mg via ORAL
  Filled 2014-11-29: qty 2

## 2014-11-29 NOTE — ED Notes (Signed)
Pt reports to the ED for eval of bilateral lower leg swelling. He has been seen here and [facility] multiple times for various things. He reports he was given Prednisone 2 weeks ago and 1 week ago he developed bilateral lower leg swelling. Denies any CP, SOB, or hx of CHF. Pt A&Ox4, resp e/u, and skin warm and dry.

## 2014-11-29 NOTE — Discharge Instructions (Signed)
Take lasix 20 mg daily for 3 days.   See your doctor.   Return to ER if you have worse swelling, chest pain, shortness of breath.

## 2014-11-29 NOTE — ED Provider Notes (Signed)
CSN: [id number]     Arrival date & time 11/29/14  1426 History   First MD Initiated Contact with Patient 11/29/14 1751     Chief Complaint  Patient presents with  . Leg Swelling     (Consider location/radiation/quality/duration/timing/severity/associated sxs/prior Treatment) The history is provided by the patient.  Dathan Attia is a 59 y.o. male hx of DM here presenting with leg swelling. Patient was seen here several weeks ago for neck pain and was prescribed prednisone taper, valium, percocet. 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 He states that his neck pain is stable. But for the last week, he noticed bilateral leg swelling. Denies chest pain or shortness of breath. No hx of DVT/PE or CHF.    Past Medical History  Diagnosis Date  . Diabetes mellitus without complication    Past Surgical History  Procedure Laterality Date  . Appendectomy     No family history on file. Social History  Substance Use Topics  . Smoking status: Never Smoker   . Smokeless tobacco: None  . Alcohol Use: 0.0 oz/week    0 Standard drinks or equivalent per week     Comment: occa    Review of Systems  Cardiovascular: Positive for leg swelling.  All other systems reviewed and are negative.     Allergies  Review of patient's allergies indicates no known allergies.  Home Medications   Prior to Admission medications   Medication Sig Start Date End Date Taking? Authorizing Provider  amLODipine-benazepril (LOTREL) 10-40 MG per capsule Take 1 capsule by mouth daily.   Yes Historical Provider, MD  atorvastatin (LIPITOR) 20 MG tablet Take 20 mg by mouth daily.   Yes Historical Provider, MD  carvedilol (COREG) 6.25 MG tablet Take 6.25 mg by mouth daily.   Yes Historical Provider, MD  diazepam (VALIUM) 10 MG tablet Take 1 tablet (10 mg total) by mouth every 6 (six) hours as  needed (muscle spasm). 10/21/14  Yes Mancel Bale, MD  glipiZIDE (GLUCOTROL XL) 10 MG 24 hr tablet Take 10 mg by mouth 2 (two) times daily.   Yes Historical Provider, MD  hydrochlorothiazide (HYDRODIURIL) 25 MG tablet Take 25 mg by mouth daily.   Yes Historical Provider, MD  insulin glargine (LANTUS) 100 UNIT/ML injection Inject 1 mL (100 Units total) into the skin at bedtime. Patient taking differently: Inject 100 Units into the skin daily.  08/27/14  Yes Peyton Najjar, MD  levothyroxine (SYNTHROID, LEVOTHROID) 200 MCG tablet Take 200 mcg by mouth daily.   Yes Historical Provider, MD  oxyCODONE-acetaminophen (ROXICET) 5-325 MG per tablet Take 1 tablet by mouth every 4 (four) hours as needed for severe pain. 10/21/14  Yes Mancel Bale, MD  predniSONE (DELTASONE) 10 MG tablet Take q day 6,5,4,3,2,1 Patient not taking: Reported on 11/03/2014 10/21/14   Mancel Bale, MD   BP 137/68 mmHg  Pulse 73  Temp(Src) 98.2 F (36.8 C) (Oral)  Resp 18  SpO2 96% Physical Exam  Constitutional: He is oriented to person, place, and time.  Chronically ill, NAD   HENT:  Head: Normocephalic.  Mouth/Throat: Oropharynx is clear and moist.  Eyes: Conjunctivae are normal. Pupils are equal, round, and reactive to light.  Neck: Normal range of motion. Neck supple.  Cardiovascular: Normal rate, regular rhythm and normal heart sounds.   Pulmonary/Chest: Effort normal and breath sounds normal. No  respiratory distress. He has no wheezes. He has no rales.  Abdominal: Soft. Bowel sounds are normal. He exhibits no distension. There is no tenderness. There is no rebound.  Musculoskeletal: Normal range of motion.  1+ edema bilaterally, no calf tenderness   Neurological: He is alert and oriented to person, place, and time.  Skin: Skin is warm and dry.  Psychiatric: He has a normal mood and affect. His behavior is normal. Judgment and thought content normal.  Nursing note and vitals reviewed.   ED Course  Procedures  (including critical care time) Labs Review Labs Reviewed  CBC WITH DIFFERENTIAL/PLATELET - Abnormal; Notable for the following:    Hemoglobin 10.3 (*)    HCT 34.0 (*)    MCH 24.0 (*)    RDW 17.3 (*)    All other components within normal limits  COMPREHENSIVE METABOLIC PANEL - Abnormal; Notable for the following:    Potassium 3.4 (*)    Creatinine, Ser 1.31 (*)    Calcium 7.8 (*)    GFR calc non Af Amer 57 (*)    All other components within normal limits    Imaging Review No results found. I have personally reviewed and evaluated these images and lab results as part of my medical decision-making.   EKG Interpretation None      MDM   Final diagnoses:  None   Aleph Nickson is a 59 y.o. male here with leg swelling. Likely from steroid use vs heat related vs dependent edema. No signs of DVT or PE and I doubt CHF. Will get labs and give lasix.   9:08 PM Cr close to baseline. Given lasix. Will dc home.   Richardean Canal, MD 11/29/14 2108

## 2014-12-07 ENCOUNTER — Ambulatory Visit (INDEPENDENT_AMBULATORY_CARE_PROVIDER_SITE_OTHER): Payer: Non-veteran care | Admitting: Neurology

## 2014-12-07 ENCOUNTER — Encounter: Payer: Self-pay | Admitting: Neurology

## 2014-12-07 ENCOUNTER — Telehealth: Payer: Self-pay | Admitting: Neurology

## 2014-12-07 ENCOUNTER — Encounter: Payer: Self-pay | Admitting: *Deleted

## 2014-12-07 VITALS — BP 122/76 | HR 108 | Ht 72.0 in | Wt 303.0 lb

## 2014-12-07 DIAGNOSIS — M5010 Cervical disc disorder with radiculopathy, unspecified cervical region: Secondary | ICD-10-CM | POA: Diagnosis not present

## 2014-12-07 NOTE — Telephone Encounter (Signed)
Patient called regarding appointment today. States he had electro stimulation at [facility] Last week. Wanted to know if we need those results for this appointment. Patient's phone signal was fading in and out. Patient was agitated initially and I advised that appointment was for New Patient appointment. I advised I could send a message to nurse regarding whether or not they needed this. Patient stated this was "a wasted phone call". I advised patient that I was trying to help him. His phone signal is fading in and out and it is difficult to understand all that he is saying, I'm losing part of what he is saying to me. Patient said "never mind, I'll see you this afternoon, this was a wasted phone call".

## 2014-12-07 NOTE — Progress Notes (Signed)
PATIENT: David Oconnor DOB: Jul 17, 1955  Chief Complaint  Patient presents with  . Neck Pain    Says three weeks ago, he woke up with severe neck pain radiating into his right shoulder and arm.  Denies accident or injury to area.  He is taking hydrocodone and cyclobenzaprine with some relief.  He has had MRIs and an EMG/NCV completed through his [place] doctor.     HISTORICAL  David Oconnor is 59 yo LH male,  [facility]  Benjiman Core, MD for evaluation of acute onset left neck pain  He had past medical history of hypertension, hyperlipidemia, works as a Doctor, hospital  He had a history of chronic neck pain, used to go down his left shoulder, he had a regular chiropractor for a while, in early August 2016, a week after a chiropractor forceful manipulation of his cervical region, he woke up one morning noticed severe right-sided neck pain, radiating pain to his right shoulder, right arm, he could barely use his arm symptoms has been persistent since then, he had worsening right side radiating pain to right shoulder arm if he leaned worse the right side, gets better lean towards his left side, he has been holding his neck to the left tilted position,  He was evaluated by [facility] neurology, I reviewed and summaried EMG nerve conduction study consistent with acute right C5-6 cervical radiculopathy, there was evidence of active denervation, neuropathic changes involving right deltoid, biceps, right C5-6 paraspinal muscles,  I also did quick screening EMG examination, there was evidence of active denervation, neuropathic changes involving right biceps, deltoid, brachioradialis,  pronator teres, there is evidence of active denervation at right C5 and 6, most consistent with an acute right C5-6 radiculopathy   I personally reviewed MRI cervical spine dated November 28 2014, multilevel cervical degenerative disc disease, most noticeable at C5 and 6,  moderate/severe RIGHT lateral recess and proximal RIGHT neuroforaminal stenosis at C5-C6.   Mild to moderate LEFT neuroforaminal stenosis at C7-T1.  Moderate spinal canal narrowing at C3-C4.    Over the past few weeks, he was given round of steroid, hydrocodone with only mild improvement of his pain, he is now complaining 7-10 out of 10 constant right neck pain,  REVIEW OF SYSTEMS: Full 14 system review of systems performed and notable only for joint pain, cramps, achy muscles, weakness  ALLERGIES: No Known Allergies  HOME MEDICATIONS: Current Outpatient Prescriptions  Medication Sig Dispense Refill  . amLODipine-benazepril (LOTREL) 10-40 MG per capsule Take 1 capsule by mouth daily.    Marland Kitchen atorvastatin (LIPITOR) 20 MG tablet Take 20 mg by mouth daily.    . carvedilol (COREG) 6.25 MG tablet Take 6.25 mg by mouth daily.    . cyclobenzaprine (FLEXERIL) 10 MG tablet take 1 tablet by mouth twice a day if needed for muscle spasm  0  . furosemide (LASIX) 20 MG tablet Take 1 tablet (20 mg total) by mouth daily. 3 tablet 0  . glipiZIDE (GLUCOTROL XL) 10 MG 24 hr tablet Take 10 mg by mouth 2 (two) times daily.    . hydrochlorothiazide (HYDRODIURIL) 25 MG tablet Take 25 mg by mouth daily.    Marland Kitchen HYDROcodone-acetaminophen (NORCO) 5-325 MG per tablet Take 1 tablet by mouth.    . insulin glargine (LANTUS) 100 UNIT/ML injection Inject 1 mL (100 Units total) into the skin at bedtime. (Patient taking differently: Inject 100 Units into the skin daily. ) 10 mL 12  . levothyroxine (  SYNTHROID, LEVOTHROID) 125 MCG tablet Take 125 mcg by mouth. Take two tablets daily.     No current facility-administered medications for this visit.    PAST MEDICAL HISTORY: Past Medical History  Diagnosis Date  . Diabetes mellitus without complication   . OSA (obstructive sleep apnea)   . Graves disease   . Diverticulitis   . Hypertension     PAST SURGICAL HISTORY: Past Surgical History  Procedure Laterality Date  .  Appendectomy    . Knee surgery Right     Torn Meniscal Repair    FAMILY HISTORY: Family History  Problem Relation Age of Onset  . Congestive Heart Failure Mother   . Diabetes Mother   . Healthy Father     SOCIAL HISTORY:  Social History   Social History  . Marital Status: Married    Spouse Name: N/A  . Number of Children: 3  . Years of Education: BS   Occupational History  . Aviation Maintenance    Social History Main Topics  . Smoking status: Former Games developer  . Smokeless tobacco: Not on file     Comment: Quit 40+ years ago.  . Alcohol Use: No     Comment: occa  . Drug Use: No  . Sexual Activity: Not on file   Other Topics Concern  . Not on file   Social History Narrative   Lives at home alone.   Left-handed.   No caffeine use.        PHYSICAL EXAM   Filed Vitals:   12/07/14 1501  BP: 122/76  Pulse: 108  Height: 6' (1.829 m)  Weight: 303 lb (137.44 kg)    Not recorded      Body mass index is 41.09 kg/(m^2).  PHYSICAL EXAMNIATION:  Gen: NAD, conversant, well nourised, obese, well groomed                     Cardiovascular: Regular rate rhythm, no peripheral edema, warm, nontender. Eyes: Conjunctivae clear without exudates or hemorrhage Neck: Supple, no carotid bruise. Pulmonary: Clear to auscultation bilaterally   NEUROLOGICAL EXAM:  MENTAL STATUS: Speech:    Speech is normal; fluent and spontaneous with normal comprehension.  Cognition:     Orientation to time, place and person     Normal recent and remote memory     Normal Attention span and concentration     Normal Language, naming, repeating,spontaneous speech     Fund of knowledge   CRANIAL NERVES: CN II: Visual fields are full to confrontation. Fundoscopic exam is normal with sharp discs and no vascular changes. Pupils are round equal and briskly reactive to light. CN III, IV, VI: extraocular movement are normal. No ptosis. CN V: Facial sensation is intact to pinprick in all 3  divisions bilaterally. Corneal responses are intact.  CN VII: Face is symmetric with normal eye closure and smile. CN VIII: Hearing is normal to rubbing fingers CN IX, X: Palate elevates symmetrically. Phonation is normal. CN XI: Head turning and shoulder shrug are intact CN XII: Tongue is midline with normal movements and no atrophy.  MOTOR: He tends to hold his neck to the left tilted position, mild right elbow flexion, abduction, moderate right shoulder external rotation weakness, slight right hand grip weakness  REFLEXES: Reflexes are decreased at right biceps, brachial radialis compared to the left side, normal bilateral triceps, knees, and ankles. Plantar responses are flexor.  SENSORY: Intact to light touch, pinprick, position sense, and vibration sense are intact  in fingers and toes.  COORDINATION: Rapid alternating movements and fine finger movements are intact. There is no dysmetria on finger-to-nose and heel-knee-shin.    GAIT/STANCE: Posture is normal. Gait is steady with normal steps, base, arm swing, and turning. Heel and toe walking are normal. Tandem gait is normal.  Romberg is absent.   DIAGNOSTIC DATA (LABS, IMAGING, TESTING) - I reviewed patient records, labs, notes, testing and imaging myself where available.   ASSESSMENT AND PLAN  David Oconnor is a 59 y.o. male with acute onset of right-sided neck pain, radiating pain to right shoulder, right arm,  Right cervical radiculopathy involving right C5, C6 nerve roots,  Confirmed by EMG nerve conduction study, MRI of cervical spine,  Refer him to neurosurgical evaluation  Continue oxycodone as needed  Return to clinic in 3 months     Levert Feinstein, M.D. Ph.D.  [facility] [address] [place], [place] [address] Ph: [phone] Fax: [phone]  CC: Benjiman Core, MD

## 2014-12-07 NOTE — Telephone Encounter (Signed)
Called [place] - transferred back to medical records in [place].  Faxed over test result request to [phone].  Waiting on records.

## 2014-12-07 NOTE — Telephone Encounter (Signed)
Records received for Dr. Terrace Arabia to review.

## 2014-12-07 NOTE — Telephone Encounter (Signed)
David Oconnor unable to release records - instructed me to call the [place] in [place] to speak with his PCP's office (Dr. Ellin Mayhew) - #[phone].

## 2014-12-07 NOTE — Telephone Encounter (Signed)
Called patient - he provided me the number for [place] in [place] [phone]) where he had his nerve conduction study.

## 2014-12-10 NOTE — Telephone Encounter (Signed)
Insurance required a "Request for additional services" form completed and faxed back to [employer] at [phone] (printed from off [employer] website).  Form completed and faxed.

## 2014-12-10 NOTE — Telephone Encounter (Signed)
Called patient back and discussed his referral.  I have [facility] a [facility] with the [facility] and faxed over a request for help twice.  Patient is going to contact them again as well.

## 2014-12-10 NOTE — Telephone Encounter (Signed)
Pt returned call. Nurse was with another pt and expressed she would call him back.

## 2014-12-10 NOTE — Telephone Encounter (Signed)
Returned call to patient - left message for him to call me back.

## 2014-12-10 NOTE — Telephone Encounter (Signed)
Patient called to talk to Dr. Terrace Arabia. Did not want to leave message. Patient stated he talked to her the other day. He insists that Dr. Terrace Arabia call him back. Patient would not leave a phone number, patient said "she already has my number, just have her call me".

## 2014-12-13 NOTE — Telephone Encounter (Signed)
Called and left another message for Micheline Chapman concerning his appt.

## 2014-12-14 NOTE — Telephone Encounter (Signed)
Left another message for David Oconnor to return my call.

## 2014-12-14 NOTE — Telephone Encounter (Signed)
[facility]ed [facility] back and left a message at the number below for a return call.

## 2014-12-14 NOTE — Telephone Encounter (Signed)
[facility] with [facility] [place] wanting to know if form had been faxed. I relayed message from [place] on 12/10/14. He would like for [facility] to call and touch base with him at [phone]. Please call and advise.

## 2014-12-15 NOTE — Telephone Encounter (Signed)
Spoke to [place] - he is aware of the status of his referral.  He spoke to the [place] and his PCP will be making his appt for him.

## 2014-12-15 NOTE — Telephone Encounter (Signed)
Spoke to [place] at [place] - he confirmed that the patient's PCP completed and submitted a request for a neurosurgical referral on 12/14/14.  Called his PCP (Dr. Ellin Mayhew [phone]) office to check on the status - left message for a return call.

## 2014-12-15 NOTE — Telephone Encounter (Signed)
Left another message for David Oconnor to return my call. 

## 2014-12-16 DIAGNOSIS — M5412 Radiculopathy, cervical region: Secondary | ICD-10-CM | POA: Insufficient documentation

## 2014-12-16 DIAGNOSIS — M5020 Other cervical disc displacement, unspecified cervical region: Secondary | ICD-10-CM | POA: Insufficient documentation

## 2014-12-22 ENCOUNTER — Telehealth: Payer: Self-pay | Admitting: Neurology

## 2014-12-22 NOTE — Telephone Encounter (Signed)
Spoke to patient - I called [facility] and they have still not received his referral yet - patient aware and will call his PCP at the [place] again today.

## 2014-12-22 NOTE — Telephone Encounter (Signed)
The patient would like a returned call. He stated he just spoke with you and would not give any details about the call. Thank you.

## 2014-12-22 NOTE — Telephone Encounter (Addendum)
Spoke to patient - stated he was seen by Trudie Buckler, PA at [facility] in [place] [phone]).  They will fax over his records for Dr. Terrace Arabia to review.

## 2015-03-08 ENCOUNTER — Ambulatory Visit: Payer: Non-veteran care | Admitting: Neurology

## 2017-08-02 ENCOUNTER — Emergency Department (HOSPITAL_COMMUNITY)

## 2017-08-02 ENCOUNTER — Other Ambulatory Visit: Payer: Self-pay

## 2017-08-02 ENCOUNTER — Emergency Department (HOSPITAL_COMMUNITY)
Admission: EM | Admit: 2017-08-02 | Discharge: 2017-08-03 | Disposition: A | Discharge status: Home / Self Care | Attending: Emergency Medicine | Admitting: Emergency Medicine

## 2017-08-02 ENCOUNTER — Encounter (HOSPITAL_COMMUNITY): Payer: Self-pay

## 2017-08-02 DIAGNOSIS — Z79899 Other long term (current) drug therapy: Secondary | ICD-10-CM | POA: Diagnosis not present

## 2017-08-02 DIAGNOSIS — N200 Calculus of kidney: Secondary | ICD-10-CM

## 2017-08-02 DIAGNOSIS — E119 Type 2 diabetes mellitus without complications: Secondary | ICD-10-CM | POA: Diagnosis not present

## 2017-08-02 DIAGNOSIS — R1032 Left lower quadrant pain: Secondary | ICD-10-CM | POA: Diagnosis present

## 2017-08-02 DIAGNOSIS — Z794 Long term (current) use of insulin: Secondary | ICD-10-CM | POA: Insufficient documentation

## 2017-08-02 DIAGNOSIS — R748 Abnormal levels of other serum enzymes: Secondary | ICD-10-CM

## 2017-08-02 DIAGNOSIS — R112 Nausea with vomiting, unspecified: Secondary | ICD-10-CM | POA: Insufficient documentation

## 2017-08-02 DIAGNOSIS — I10 Essential (primary) hypertension: Secondary | ICD-10-CM | POA: Insufficient documentation

## 2017-08-02 DIAGNOSIS — N1330 Unspecified hydronephrosis: Secondary | ICD-10-CM | POA: Diagnosis not present

## 2017-08-02 DIAGNOSIS — N202 Calculus of kidney with calculus of ureter: Secondary | ICD-10-CM | POA: Insufficient documentation

## 2017-08-02 LAB — URINALYSIS, ROUTINE W REFLEX MICROSCOPIC
Bilirubin Urine: NEGATIVE
Glucose, UA: NEGATIVE mg/dL
Ketones, ur: NEGATIVE mg/dL
Leukocytes, UA: NEGATIVE
Nitrite: NEGATIVE
Protein, ur: 30 mg/dL — AB
RBC / HPF: >50 RBC/hpf — ABNORMAL HIGH (ref 0–5)
SPECIFIC GRAVITY, URINE: 1.015 (ref 1.005–1.030)
pH: 5 (ref 5.0–8.0)

## 2017-08-02 LAB — CBC
HEMATOCRIT: 42.1 % (ref 39.0–52.0)
Hemoglobin: 13.8 g/dL (ref 13.0–17.0)
MCH: 30.5 pg (ref 26.0–34.0)
MCHC: 32.8 g/dL (ref 30.0–36.0)
MCV: 93.1 fL (ref 78.0–100.0)
Platelets: 327 10*3/uL (ref 150–400)
RBC: 4.52 MIL/uL (ref 4.22–5.81)
RDW: 13.3 % (ref 11.5–15.5)
WBC: 14.1 10*3/uL — ABNORMAL HIGH (ref 4.0–10.5)

## 2017-08-02 LAB — COMPREHENSIVE METABOLIC PANEL
ALBUMIN: 4.6 g/dL (ref 3.5–5.0)
ALK PHOS: 75 U/L (ref 38–126)
ALT: 20 U/L (ref 17–63)
ANION GAP: 12 (ref 5–15)
AST: 26 U/L (ref 15–41)
BILIRUBIN TOTAL: 0.6 mg/dL (ref 0.3–1.2)
BUN: 17 mg/dL (ref 6–20)
CALCIUM: 9.1 mg/dL (ref 8.9–10.3)
CO2: 23 mmol/L (ref 22–32)
Chloride: 107 mmol/L (ref 101–111)
Creatinine, Ser: 1.49 mg/dL — ABNORMAL HIGH (ref 0.61–1.24)
GFR calc Af Amer: 56 mL/min — ABNORMAL LOW (ref >60)
GFR calc non Af Amer: 48 mL/min — ABNORMAL LOW (ref >60)
GLUCOSE: 182 mg/dL — AB (ref 65–99)
POTASSIUM: 4.4 mmol/L (ref 3.5–5.1)
Sodium: 142 mmol/L (ref 135–145)
Total Protein: 8.2 g/dL — ABNORMAL HIGH (ref 6.5–8.1)

## 2017-08-02 LAB — LIPASE, BLOOD: Lipase: 101 U/L — ABNORMAL HIGH (ref 11–51)

## 2017-08-02 MED ORDER — HYDROMORPHONE HCL 1 MG/ML IJ SOLN: 0.5000 mg | Freq: Once | INTRAMUSCULAR | Status: DC

## 2017-08-02 MED ORDER — TAMSULOSIN HCL 0.4 MG PO CAPS: 0.4000 mg | ORAL_CAPSULE | Freq: Every day | ORAL | 0 refills | Status: DC

## 2017-08-02 MED ORDER — KETOROLAC TROMETHAMINE 30 MG/ML IJ SOLN
30.0000 mg | Freq: Once | INTRAMUSCULAR | Status: AC
  Administered 2017-08-02: 30 mg via INTRAVENOUS
  Filled 2017-08-02: qty 1

## 2017-08-02 MED ORDER — ONDANSETRON HCL 4 MG/2ML IJ SOLN
4.0000 mg | Freq: Once | INTRAMUSCULAR | Status: AC
  Administered 2017-08-02: 4 mg via INTRAVENOUS
  Filled 2017-08-02: qty 2

## 2017-08-02 MED ORDER — MORPHINE SULFATE (PF) 4 MG/ML IV SOLN
4.0000 mg | Freq: Once | INTRAVENOUS | Status: DC
Start: 2017-08-02 — End: 2017-08-02
  Filled 2017-08-02: qty 1

## 2017-08-02 MED ORDER — SODIUM CHLORIDE 0.9 % IV BOLUS
500.0000 mL | Freq: Once | INTRAVENOUS | Status: AC
  Administered 2017-08-02: 500 mL via INTRAVENOUS

## 2017-08-02 MED ORDER — ONDANSETRON 4 MG PO TBDP: ORAL_TABLET | ORAL | 0 refills | Status: DC

## 2017-08-02 MED ORDER — IOHEXOL 300 MG/ML  SOLN: 30.0000 mL | Freq: Once | INTRAMUSCULAR | Status: DC | PRN

## 2017-08-02 MED ORDER — OXYCODONE-ACETAMINOPHEN 5-325 MG PO TABS: 1.0000 | ORAL_TABLET | Freq: Four times a day (QID) | ORAL | 0 refills | Status: DC | PRN

## 2017-08-02 MED ORDER — ONDANSETRON 4 MG PO TBDP
4.0000 mg | ORAL_TABLET | Freq: Once | ORAL | Status: AC | PRN
  Administered 2017-08-02: 4 mg via ORAL
  Filled 2017-08-02: qty 1

## 2017-08-02 NOTE — ED Triage Notes (Addendum)
Patient presents with nausea/vomiting and abdominal pain, starting approx 1400 today. Patient reports prior to the onset of his symptoms, he took his daily medications and ate a donut. Patient reports left sided abdominal pain that is described as "sharp" pain. Pt with history of diverticulitis. Pt reports "regular" daily BM's. Pt dry heaving in triage. SL Zofran administered per protocol.

## 2017-08-02 NOTE — ED Notes (Signed)
Blue and gold top drawn in triage. 

## 2017-08-02 NOTE — ED Notes (Signed)
Pt refuses to change into a gown, states "the doctor does not need to assess me in a gown".

## 2017-08-02 NOTE — ED Provider Notes (Signed)
[facility]-EMERGENCY DEPT Provider Note   CSN: [id number] Arrival date & time: 08/02/17  1859     History   Chief Complaint Chief Complaint  Patient presents with  . Abdominal Pain    HPI Stclair Szymborski is a 62 y.o. male.  Patient is a 62 year old male with a history of diabetes, hypertension, diverticulitis and Graves' disease who presents with abdominal pain.  He states he is doing fine through most of the day and then about 2:30 PM today he had a sudden onset of pain in his left mid abdomen.  It is nonradiating.  He has had associated nausea and vomiting.  He had one episode of diarrhea.  He says is not related to eating.  It is not related to movement.  He has no associated back pain.  No groin pain.  No urinary symptoms.  No fevers.  No history of similar symptoms in the past.  He took some Maalox prior to arrival with no improvement in symptoms.     Past Medical History:  Diagnosis Date  . Diabetes mellitus without complication (HCC)   . Diverticulitis   . Graves disease   . Hypertension   . OSA (obstructive sleep apnea)     There are no active problems to display for this patient.   Past Surgical History:  Procedure Laterality Date  . APPENDECTOMY    . KNEE SURGERY Right    Torn Meniscal Repair        Home Medications    Prior to Admission medications   Medication Sig Start Date End Date Taking? Authorizing Provider  amLODipine-benazepril (LOTREL) 10-40 MG per capsule Take 1 capsule by mouth daily.   Yes [provider]  atorvastatin (LIPITOR) 40 MG tablet Take 40 mg by mouth daily. 05/31/17  Yes [provider]  carvedilol (COREG) 6.25 MG tablet Take 6.25 mg by mouth daily.   Yes [provider]  cyclobenzaprine (FLEXERIL) 10 MG tablet take 1 tablet by mouth twice a day if needed for muscle spasm 11/15/14  Yes [provider]  furosemide (LASIX) 20 MG tablet Take 1 tablet (20 mg total) by mouth  daily. 11/29/14  Yes Charlynne Pander, MD  glipiZIDE (GLUCOTROL XL) 10 MG 24 hr tablet Take 10 mg by mouth 2 (two) times daily.   Yes [provider]  hydrochlorothiazide (HYDRODIURIL) 25 MG tablet Take 25 mg by mouth daily.   Yes [provider]  insulin glargine (LANTUS) 100 UNIT/ML injection Inject 1 mL (100 Units total) into the skin at bedtime. Patient taking differently: Inject 100 Units into the skin daily.  08/27/14  Yes Peyton Najjar, MD  levothyroxine (SYNTHROID, LEVOTHROID) 125 MCG tablet Take 125 mcg by mouth. Take two tablets daily.   Yes [provider]  VIAGRA 100 MG tablet Take 100 mg by mouth daily as needed. 05/31/17  Yes [provider]  HYDROcodone-acetaminophen (NORCO) 5-325 MG per tablet Take 1 tablet by mouth. 11/27/14   [provider]  ondansetron (ZOFRAN ODT) 4 MG disintegrating tablet  ODT q4 hours prn nausea/vomit 08/02/17   Rolan Bucco, MD  oxyCODONE-acetaminophen (PERCOCET) 5-325 MG tablet Take 1-2 tablets by mouth every 6 (six) hours as needed. 08/02/17   Rolan Bucco, MD  tamsulosin (FLOMAX) 0.4 MG CAPS capsule Take 1 capsule (0.4 mg total) by mouth daily. 08/02/17   Rolan Bucco, MD    Family History Family History  Problem Relation Age of Onset  . Congestive Heart Failure Mother   .  Diabetes Mother   . Healthy Father     Social History Social History   Tobacco Use  . Smoking status: Former Games developer  . Tobacco comment: Quit 40+ years ago.  Substance Use Topics  . Alcohol use: No    Alcohol/week: 0.0 oz    Comment: occa  . Drug use: No     Allergies   Patient has no known allergies.   Review of Systems Review of Systems  Constitutional: Negative for chills, diaphoresis, fatigue and fever.  HENT: Negative for congestion, rhinorrhea and sneezing.   Eyes: Negative.   Respiratory: Negative for cough, chest tightness and shortness of breath.   Cardiovascular: Negative for chest pain and leg swelling.   Gastrointestinal: Positive for abdominal pain, nausea and vomiting. Negative for blood in stool and diarrhea.  Genitourinary: Negative for difficulty urinating, flank pain, frequency and hematuria.  Musculoskeletal: Negative for arthralgias and back pain.  Skin: Negative for rash.  Neurological: Negative for dizziness, speech difficulty, weakness, numbness and headaches.     Physical Exam Updated Vital Signs BP 135/80 (BP Location: Left Arm)   Pulse 88   Temp 98.7 F (37.1 C) (Oral)   Resp 18   Ht 6' (1.829 m)   Wt 129.3 kg (285 lb)   SpO2 98%   BMI 38.65 kg/m   Physical Exam  Constitutional: He is oriented to person, place, and time. He appears well-developed and well-nourished.  HENT:  Head: Normocephalic and atraumatic.  Eyes: Pupils are equal, round, and reactive to light.  Neck: Normal range of motion. Neck supple.  Cardiovascular: Normal rate, regular rhythm and normal heart sounds.  Pulmonary/Chest: Effort normal and breath sounds normal. No respiratory distress. He has no wheezes. He has no rales. He exhibits no tenderness.  Abdominal: Soft. Bowel sounds are normal. There is tenderness (Left mid abdomen). There is no rebound and no guarding.  Musculoskeletal: Normal range of motion. He exhibits no edema.  Lymphadenopathy:    He has no cervical adenopathy.  Neurological: He is alert and oriented to person, place, and time.  Skin: Skin is warm and dry. No rash noted.  Psychiatric: He has a normal mood and affect.     ED Treatments / Results  Labs (all labs ordered are listed, but only abnormal results are displayed) Labs Reviewed  LIPASE, BLOOD - Abnormal; Notable for the following components:      Result Value   Lipase 101 (*)    All other components within normal limits  COMPREHENSIVE METABOLIC PANEL - Abnormal; Notable for the following components:   Glucose, Bld 182 (*)    Creatinine, Ser 1.49 (*)    Total Protein 8.2 (*)    GFR calc non Af Amer 48 (*)      GFR calc Af Amer 56 (*)    All other components within normal limits  CBC - Abnormal; Notable for the following components:   WBC 14.1 (*)    All other components within normal limits  URINALYSIS, ROUTINE W REFLEX MICROSCOPIC - Abnormal; Notable for the following components:   APPearance CLOUDY (*)    Hgb urine dipstick LARGE (*)    Protein, ur 30 (*)    RBC / HPF >50 (*)    Bacteria, UA RARE (*)    All other components within normal limits    EKG None  Radiology Ct Renal Stone Study  Result Date: 08/02/2017 CLINICAL DATA:  62 year old male with nausea vomiting and abdominal pain. History of diverticulitis. EXAM: CT  ABDOMEN AND PELVIS WITHOUT CONTRAST TECHNIQUE: Multidetector CT imaging of the abdomen and pelvis was performed following the standard protocol without IV contrast. COMPARISON:  None. FINDINGS: Evaluation of this exam is limited in the absence of intravenous contrast. Lower chest: The visualized lung bases are clear. There is coronary vascular calcification. No intra-abdominal free air or free fluid. Hepatobiliary: No focal liver abnormality is seen. No gallstones, gallbladder wall thickening, or biliary dilatation. Pancreas: Unremarkable. No pancreatic ductal dilatation or surrounding inflammatory changes. Spleen: Normal in size without focal abnormality. Adrenals/Urinary Tract: The adrenal glands are unremarkable. There is a 4 mm stone in the proximal left ureter with mild left hydronephrosis. Multiple nonobstructing calculi noted in the inferior pole of the left kidney measuring up to 4-5 mm. Additional smaller nonobstructing stones noted in the inferior pole of the right kidney measure up to 3-4 mm. There is no hydronephrosis on the right. The right ureter and the urinary bladder appears unremarkable. Stomach/Bowel: There is postsurgical changes of bowel with anastomotic sutures involving the stomach and small bowel loop. There is no bowel obstruction or active inflammation. The  appendix is surgically absent. Vascular/Lymphatic: There is mild aortoiliac atherosclerotic disease. The IVC is unremarkable. No portal venous gas. There is no adenopathy. Reproductive: The prostate and seminal vesicles are grossly unremarkable. Other: None Musculoskeletal: Old-appearing fractures of the right transverse processes of lumbar vertebra. No acute osseous pathology. There is degenerative changes and disc desiccation at L5-S1 with grade 1 retrolisthesis at this level. IMPRESSION: A 4 mm stone in the proximal left ureter with mild left hydronephrosis. Additional non-obstructing renal calculi noted in both kidneys. There is no hydronephrosis on the right. Postsurgical changes of bowel. No bowel obstruction or active inflammation. Mild Aortic Atherosclerosis (ICD10-I70.0). Electronically Signed   By: Elgie Collard M.D.   On: 08/02/2017 22:35    Procedures Procedures (including critical care time)  Medications Ordered in ED Medications  iohexol (OMNIPAQUE) 300 MG/ML solution 30 mL (has no administration in time range)  ketorolac (TORADOL) 30 MG/ML injection 30 mg (has no administration in time range)  ondansetron (ZOFRAN-ODT) disintegrating tablet 4 mg (4 mg Oral Given 08/02/17 1941)  ondansetron (ZOFRAN) injection 4 mg (4 mg Intravenous Given 08/02/17 2212)  sodium chloride 0.9 % bolus 500 mL (500 mLs Intravenous New Bag/Given 08/02/17 2211)     Initial Impression / Assessment and Plan / ED Course  I have reviewed the triage vital signs and the nursing notes.  Pertinent labs & imaging results that were available during my care of the patient were reviewed by me and considered in my medical decision making (see chart for details).     Patient is a 62 year old male who presents with sudden onset of left flank pain.  CT scan shows evidence of a 4 mm proximal left ureteral stone.  There is no evidence of urinary infection.  He initially was ordered pain medication in the ED but states he is  driving and does not want anything that will prevent him from driving home.  He was then given Toradol.  His pain is controlled in the ED.  He has no ongoing vomiting.  No fevers or other signs of infection.  His creatinine is elevated but not significantly elevated as compared to his prior values.  He was discharged home in good condition.  He was given a referral to follow-up with urology.  Return precautions were given.  His lipase is mildly elevated.  This could be related to the vomiting that he  has had.  There is no evidence of pancreatitis on CT scan.  He does not have any tenderness on palpation over his pancreas.  He was encouraged to have his primary care physician follow this.  Final Clinical Impressions(s) / ED Diagnoses   Final diagnoses:  Kidney stone  Elevated lipase    ED Discharge Orders        Ordered    oxyCODONE-acetaminophen (PERCOCET) 5-325 MG tablet  Every 6 hours PRN     08/02/17 2313    ondansetron (ZOFRAN ODT) 4 MG disintegrating tablet     08/02/17 2313    tamsulosin (FLOMAX) 0.4 MG CAPS capsule  Daily     08/02/17 2313       Rolan Bucco, MD 08/02/17 2316

## 2017-08-02 NOTE — Discharge Instructions (Signed)
You need to follow-up within the next 1 to 2 weeks with urology.  Your lipase (pancreas test) was mildly elevated.  This can be rechecked by your primary care physician.  There is no evidence of inflammation of your pancreas on the CT scan.  Return to the emergency room if you have worsening pain, vomiting, fevers or difficulty urinating.

## 2017-09-30 NOTE — Progress Notes (Signed)
10/01/2017 9:59 AM   David Oconnor 1955-04-28 [id number]  Referring provider: No referring provider defined for this encounter.  Chief Complaint  Patient presents with  . Nephrolithiasis    HPI: Patient is a 62 year old African-American male who was referred by [facility] ED for a left ureteral stone.  He presented to the ED on Aug 02, 2017 with complaint of left mid abdominal pain associated with nausea, vomiting and diarrhea.  CT Renal stone study noted the adrenal glands are unremarkable. There is a 4 mm stone in the proximal left ureter with mild left hydronephrosis. Multiple nonobstructing calculi noted in the inferior pole of the left kidney measuring up to 4-5 mm. Additional smaller nonobstructing stones noted in the inferior pole of the right kidney measure up to 3-4 mm. There is no hydronephrosis on the right. The right ureter and the urinary bladder appears unremarkable.His UA contained greater than 50 RBCs and 6-10 WBCs.  His WBC count was 14.1.  His serum creatinine was 1.49.  He was given Toradol and Zofran in the emergency room.  He was discharged with Percocet, Flomax and Toradol.  He states he has no symptoms at this time and he even wondered why he kept this appointment.  He did not capture a fragment, but he states he was not going to carry that big strainer everywhere.  Patient denies any gross hematuria, dysuria or suprapubic/flank pain.  Patient denies any fevers, chills, nausea or vomiting.   His UA was positive for calcium oxalate crystals.    PMH: Past Medical History:  Diagnosis Date  . Diabetes mellitus without complication ([place])   . Diverticulitis   . Graves disease   . Hypertension   . OSA (obstructive sleep apnea)     Surgical History: Past Surgical History:  Procedure Laterality Date  . APPENDECTOMY    . KNEE SURGERY Right    Torn Meniscal Repair    Home Medications:  Allergies as of 10/01/2017   No Known Allergies     Medication List        Accurate as of 10/01/17 11:59 PM. Always use your most recent med list.          atorvastatin 40 MG tablet Commonly known as:  LIPITOR Take 40 mg by mouth daily.   carvedilol 6.25 MG tablet Commonly known as:  COREG Take 6.25 mg by mouth daily.   insulin glargine 100 UNIT/ML injection Commonly known as:  LANTUS Inject 1 mL (100 Units total) into the skin at bedtime.   levothyroxine 125 MCG tablet Commonly known as:  SYNTHROID, LEVOTHROID Take 125 mcg by mouth. Take two tablets daily.   metFORMIN 1000 MG tablet Commonly known as:  GLUCOPHAGE Take 1,000 mg by mouth daily with breakfast.   VIAGRA 100 MG tablet Generic drug:  sildenafil Take 100 mg by mouth daily as needed.       Allergies: No Known Allergies  Family History: Family History  Problem Relation Age of Onset  . Congestive Heart Failure Mother   . Diabetes Mother   . Healthy Father   . Prostate cancer Neg Hx   . Bladder Cancer Neg Hx   . Kidney cancer Neg Hx     Social History:  reports that he has quit smoking. He has never used smokeless tobacco. He reports that he does not drink alcohol or use drugs.  ROS: UROLOGY Frequent Urination?: No Hard to postpone urination?: No Burning/pain with urination?: No Get up at night to urinate?: No  Leakage of urine?: No Urine stream starts and stops?: No Trouble starting stream?: No Do you have to strain to urinate?: No Blood in urine?: No Urinary tract infection?: No Sexually transmitted disease?: No Injury to kidneys or bladder?: No Painful intercourse?: No Weak stream?: No Erection problems?: No Penile pain?: No  Gastrointestinal Nausea?: No Vomiting?: No Indigestion/heartburn?: No Diarrhea?: No Constipation?: No  Constitutional Fever: No Night sweats?: No Weight loss?: No Fatigue?: No  Skin Skin rash/lesions?: No Itching?: No  Eyes Blurred vision?: No Double vision?: No  Ears/Nose/Throat Sore throat?: No Sinus problems?:  No  Hematologic/Lymphatic Swollen glands?: No Easy bruising?: No  Cardiovascular Leg swelling?: No Chest pain?: No  Respiratory Cough?: No Shortness of breath?: No  Endocrine Excessive thirst?: No  Musculoskeletal Back pain?: No Joint pain?: No  Neurological Headaches?: No Dizziness?: No  Psychologic Depression?: No Anxiety?: No  Physical Exam: BP 123/76 (BP Location: Left Arm, Patient Position: Sitting, Cuff Size: Large)   Pulse 73   Ht 6' (1.829 m)   Wt 299 lb (135.6 kg)   BMI 40.55 kg/m   Constitutional:  Well nourished. Alert and oriented, No acute distress. HEENT: [place] AT, moist mucus membranes.  Trachea midline, no masses. Cardiovascular: No clubbing, cyanosis, or edema. Respiratory: Normal respiratory effort, no increased work of breathing. GI: Abdomen is soft, non tender, non distended, no abdominal masses. Liver and spleen not palpable.  No hernias appreciated.  Stool sample for occult testing is not indicated.   GU: No CVA tenderness.  No bladder fullness or masses.   Skin: No rashes, bruises or suspicious lesions. Lymph: No cervical or inguinal adenopathy. Neurologic: Grossly intact, no focal deficits, moving all 4 extremities. Psychiatric: Normal mood and affect.  Laboratory Data: Lab Results  Component Value Date   WBC 9.3 10/01/2017   HGB 12.0 (L) 10/01/2017   HCT 36.4 (L) 10/01/2017   MCV 91 10/01/2017   PLT 282 10/01/2017    Lab Results  Component Value Date   CREATININE 1.50 (H) 10/01/2017    No results found for: PSA  No results found for: TESTOSTERONE  No results found for: HGBA1C  No results found for: TSH  No results found for: CHOL, HDL, CHOLHDL, VLDL, LDLCALC  Lab Results  Component Value Date   AST 26 08/02/2017   Lab Results  Component Value Date   ALT 20 08/02/2017   No components found for: ALKALINEPHOPHATASE No components found for: BILIRUBINTOTAL  No results found for: ESTRADIOL  Urinalysis See HPI and  Epic.   I have reviewed the labs.   Pertinent Imaging: CLINICAL DATA:  62 year old male with nausea vomiting and abdominal pain. History of diverticulitis.  EXAM: CT ABDOMEN AND PELVIS WITHOUT CONTRAST  TECHNIQUE: Multidetector CT imaging of the abdomen and pelvis was performed following the standard protocol without IV contrast.  COMPARISON:  None.  FINDINGS: Evaluation of this exam is limited in the absence of intravenous contrast.  Lower chest: The visualized lung bases are clear. There is coronary vascular calcification.  No intra-abdominal free air or free fluid.  Hepatobiliary: No focal liver abnormality is seen. No gallstones, gallbladder wall thickening, or biliary dilatation.  Pancreas: Unremarkable. No pancreatic ductal dilatation or surrounding inflammatory changes.  Spleen: Normal in size without focal abnormality.  Adrenals/Urinary Tract: The adrenal glands are unremarkable. There is a 4 mm stone in the proximal left ureter with mild left hydronephrosis. Multiple nonobstructing calculi noted in the inferior pole of the left kidney measuring up to 4-5 mm. Additional  smaller nonobstructing stones noted in the inferior pole of the right kidney measure up to 3-4 mm. There is no hydronephrosis on the right. The right ureter and the urinary bladder appears unremarkable.  Stomach/Bowel: There is postsurgical changes of bowel with anastomotic sutures involving the stomach and small bowel loop. There is no bowel obstruction or active inflammation. The appendix is surgically absent.  Vascular/Lymphatic: There is mild aortoiliac atherosclerotic disease. The IVC is unremarkable. No portal venous gas. There is no adenopathy.  Reproductive: The prostate and seminal vesicles are grossly unremarkable.  Other: None  Musculoskeletal: Old-appearing fractures of the right transverse processes of lumbar vertebra. No acute osseous pathology. There  is degenerative changes and disc desiccation at L5-S1 with grade 1 retrolisthesis at this level.  IMPRESSION: A 4 mm stone in the proximal left ureter with mild left hydronephrosis. Additional non-obstructing renal calculi noted in both kidneys. There is no hydronephrosis on the right.  Postsurgical changes of bowel. No bowel obstruction or active inflammation.  Mild Aortic Atherosclerosis (ICD10-I70.0).   Electronically Signed   By: Anner Crete M.D.   On: 08/02/2017 22:35  I have independently reviewed the films.    Assessment & Plan:    1. Left ureteral stone/left renal stones explained to the patient that AUA Guidelines for patients with uncomplicated ureteral stones ?10 mm should be offered observation, and those with distal stones of similar size should be offered MET with ?-blockers - He feels that he has passed the ureteral stone as he has not had pain and it has been over one month I suggested that we needed to obtain a RUS to confirm that the hydronephrosis had resolved but he wanted to address his left renal stones as well at this time I explained that URS would be the best option because if a left ureteral stone was still present it could then be addressed at the time of the URS  schedule left ureteroscopy with laser lithotripsy and ureteral stent placement explained to the patient how the procedure is performed and the risks involved  informed patient that they will have a stent placed during the procedure and will remain in place after the procedure for a short time.  stent may be removed in the office with a cystoscope or patient may be instructed to remove the stent themselves by the string described "stent pain" as feelings of needing to urinate/overactive bladder and a warm, tingling sensation to intense pain in the affected flank residual stones within the kidney or ureter may be present after the procedure and may need to have these addressed at a  different encounter injury to the ureter is the most common intra-operative risk, it may result in an open procedure to correct the defect infection and bleeding are also risks explained the risks of general anesthesia, such as: MI, CVA, paralysis, coma and/or death. advised to contact our office or seek treatment in the ED if becomes febrile or pain/ vomiting are difficult control in order to arrange for emergent/urgent intervention  He would like to check with his insurance regarding his coverage for this procedure prior to scheduling.  If he decides not to undergo URS at this time, he will need the RUS to confirm the left ureteral stone has passed  2. Left hydronephrosis obtain RUS to ensure the hydronephrosis has resolved once they have passed and/or recovered from procedure to ensure to iatrogenic hydronephrosis remains - it is explained to the patient that it is important to document resolution of  the hydronephrosis as "silent hydronephrosis" can occur and cause damage and/or loss of the kidney   Return for left URS/LL/ureteral stent placement .  These notes generated with voice recognition software. I apologize for typographical errors.  Zara Council, PA-C  [facility] [address]  [place], [place] [address] [phone]

## 2017-10-01 ENCOUNTER — Ambulatory Visit (INDEPENDENT_AMBULATORY_CARE_PROVIDER_SITE_OTHER): Admitting: Urology

## 2017-10-01 ENCOUNTER — Encounter: Payer: Self-pay | Admitting: Urology

## 2017-10-01 VITALS — BP 123/76 | HR 73 | Ht 72.0 in | Wt 299.0 lb

## 2017-10-01 DIAGNOSIS — N201 Calculus of ureter: Secondary | ICD-10-CM | POA: Diagnosis not present

## 2017-10-01 DIAGNOSIS — N132 Hydronephrosis with renal and ureteral calculous obstruction: Secondary | ICD-10-CM

## 2017-10-01 LAB — URINALYSIS, COMPLETE
Bilirubin, UA: NEGATIVE
Ketones, UA: NEGATIVE
Leukocytes, UA: NEGATIVE
NITRITE UA: NEGATIVE
PH UA: 5 (ref 5.0–7.5)
Protein, UA: NEGATIVE
RBC, UA: NEGATIVE
Specific Gravity, UA: 1.01 (ref 1.005–1.030)
UUROB: 0.2 mg/dL (ref 0.2–1.0)

## 2017-10-01 LAB — MICROSCOPIC EXAMINATION

## 2017-10-02 ENCOUNTER — Encounter: Payer: Self-pay | Admitting: Urology

## 2017-10-02 ENCOUNTER — Telehealth: Payer: Self-pay | Admitting: Radiology

## 2017-10-02 LAB — CBC WITH DIFFERENTIAL/PLATELET
BASOS ABS: 0.1 10*3/uL (ref 0.0–0.2)
Basos: 1 %
EOS (ABSOLUTE): 0.3 10*3/uL (ref 0.0–0.4)
EOS: 4 %
HEMOGLOBIN: 12 g/dL — AB (ref 13.0–17.7)
Hematocrit: 36.4 % — ABNORMAL LOW (ref 37.5–51.0)
IMMATURE GRANULOCYTES: 0 %
Immature Grans (Abs): 0 10*3/uL (ref 0.0–0.1)
LYMPHS ABS: 1.9 10*3/uL (ref 0.7–3.1)
Lymphs: 20 %
MCH: 30.1 pg (ref 26.6–33.0)
MCHC: 33 g/dL (ref 31.5–35.7)
MCV: 91 fL (ref 79–97)
Monocytes Absolute: 0.7 10*3/uL (ref 0.1–0.9)
Monocytes: 7 %
NEUTROS PCT: 68 %
Neutrophils Absolute: 6.3 10*3/uL (ref 1.4–7.0)
Platelets: 282 10*3/uL (ref 150–450)
RBC: 3.99 x10E6/uL — AB (ref 4.14–5.80)
RDW: 14.4 % (ref 12.3–15.4)
WBC: 9.3 10*3/uL (ref 3.4–10.8)

## 2017-10-02 LAB — BASIC METABOLIC PANEL
BUN / CREAT RATIO: 13 (ref 10–24)
BUN: 20 mg/dL (ref 8–27)
CHLORIDE: 100 mmol/L (ref 96–106)
CO2: 23 mmol/L (ref 20–29)
CREATININE: 1.5 mg/dL — AB (ref 0.76–1.27)
Calcium: 8.6 mg/dL (ref 8.6–10.2)
GFR calc Af Amer: 56 mL/min/{1.73_m2} — ABNORMAL LOW (ref >59)
GFR calc non Af Amer: 49 mL/min/{1.73_m2} — ABNORMAL LOW (ref >59)
GLUCOSE: 258 mg/dL — AB (ref 65–99)
Potassium: 4.3 mmol/L (ref 3.5–5.2)
Sodium: 138 mmol/L (ref 134–144)

## 2017-10-02 NOTE — Telephone Encounter (Signed)
Spoke to patient about scheduling surgery to treat kidney stones. Patient would like to speak with his insurance company first & will call back with decision.

## 2017-10-02 NOTE — Telephone Encounter (Signed)
-----   Message from Harle BattiestShannon A McGowan, PA-C sent at 10/02/2017  7:58 AM EDT ----- Has Mr. David Oconnor called back about scheduling his URS?

## 2017-10-02 NOTE — Telephone Encounter (Signed)
Patient would like to schedule surgery for 11/01/2017 due to his schedule. Will follow up with instructions once pre-admit testing appointment has been scheduled. Questions answered. Patient voices understanding.

## 2017-10-04 LAB — CULTURE, URINE COMPREHENSIVE

## 2017-10-08 ENCOUNTER — Ambulatory Visit (HOSPITAL_COMMUNITY): Admitting: Psychiatry

## 2017-10-08 ENCOUNTER — Other Ambulatory Visit: Payer: Self-pay | Admitting: Radiology

## 2017-10-08 DIAGNOSIS — N201 Calculus of ureter: Secondary | ICD-10-CM

## 2017-10-08 NOTE — Telephone Encounter (Signed)
Unable to reach patient by phone due to no answer & no voicemail available. 

## 2017-10-09 ENCOUNTER — Other Ambulatory Visit: Payer: Self-pay | Admitting: Radiology

## 2017-10-09 DIAGNOSIS — N201 Calculus of ureter: Secondary | ICD-10-CM

## 2017-10-09 NOTE — Telephone Encounter (Addendum)
Unable to reach patient by phone due to no answer & no voicemail available. Need to notify patient that referral from [facility] was approved, pre-admit testing appointment is scheduled on 10/25/2017 at 9:00 & post op follow up with Dr Lonna CobbStoioff will be on 12/05/2017 at 9:15 with renal ultrasound prior. Hospital will call to schedule ultrasound.

## 2017-10-15 NOTE — Telephone Encounter (Addendum)
Unable to reach patient by phone due to no answer & no voicemail available. Letter mailed to patient with appointments & instructions.

## 2017-10-23 NOTE — Telephone Encounter (Signed)
Unable to reach patient by phone due to no answer & no voicemail available.

## 2017-10-24 DIAGNOSIS — I10 Essential (primary) hypertension: Secondary | ICD-10-CM | POA: Insufficient documentation

## 2017-10-24 DIAGNOSIS — E119 Type 2 diabetes mellitus without complications: Secondary | ICD-10-CM | POA: Insufficient documentation

## 2017-10-25 ENCOUNTER — Inpatient Hospital Stay: Admission: RE | Admit: 2017-10-25 | Source: Ambulatory Visit

## 2017-10-28 ENCOUNTER — Inpatient Hospital Stay: Admission: RE | Admit: 2017-10-28 | Source: Ambulatory Visit

## 2017-10-30 ENCOUNTER — Encounter
Admission: RE | Admit: 2017-10-30 | Discharge: 2017-10-30 | Disposition: A | Discharge status: Home / Self Care | Source: Ambulatory Visit | Attending: Urology | Admitting: Urology

## 2017-10-30 ENCOUNTER — Other Ambulatory Visit: Payer: Self-pay

## 2017-10-30 DIAGNOSIS — E119 Type 2 diabetes mellitus without complications: Secondary | ICD-10-CM | POA: Diagnosis not present

## 2017-10-30 DIAGNOSIS — G4730 Sleep apnea, unspecified: Secondary | ICD-10-CM | POA: Diagnosis not present

## 2017-10-30 DIAGNOSIS — Z87891 Personal history of nicotine dependence: Secondary | ICD-10-CM | POA: Diagnosis not present

## 2017-10-30 DIAGNOSIS — Z9989 Dependence on other enabling machines and devices: Secondary | ICD-10-CM | POA: Diagnosis not present

## 2017-10-30 DIAGNOSIS — I10 Essential (primary) hypertension: Secondary | ICD-10-CM | POA: Diagnosis not present

## 2017-10-30 DIAGNOSIS — Z79899 Other long term (current) drug therapy: Secondary | ICD-10-CM | POA: Diagnosis not present

## 2017-10-30 DIAGNOSIS — N132 Hydronephrosis with renal and ureteral calculous obstruction: Secondary | ICD-10-CM | POA: Diagnosis not present

## 2017-10-30 DIAGNOSIS — Z538 Procedure and treatment not carried out for other reasons: Secondary | ICD-10-CM | POA: Diagnosis not present

## 2017-10-30 HISTORY — DX: Personal history of urinary calculi: Z87.442

## 2017-10-30 LAB — URINALYSIS, COMPLETE (UACMP) WITH MICROSCOPIC
BACTERIA UA: NONE SEEN
Bilirubin Urine: NEGATIVE
Glucose, UA: >=500 mg/dL — AB
Hgb urine dipstick: NEGATIVE
Ketones, ur: NEGATIVE mg/dL
Leukocytes, UA: NEGATIVE
Nitrite: NEGATIVE
PROTEIN: NEGATIVE mg/dL
Specific Gravity, Urine: 1.017 (ref 1.005–1.030)
pH: 5 (ref 5.0–8.0)

## 2017-10-30 NOTE — Pre-Procedure Instructions (Signed)
Abnormal urinalysis faxed to Dr Lonna CobbStoioff

## 2017-10-30 NOTE — Patient Instructions (Signed)
Your procedure is scheduled on: November 01, 2017 Friday Report to Day Surgery on the 2nd floor of the [facility]. To find out your arrival time, please call [phone] between 1PM - 3PM on: October 31, 2017 Thursday  REMEMBER: Instructions that are not followed completely may result in serious medical risk, up to and including death; or upon the discretion of your surgeon and anesthesiologist your surgery may need to be rescheduled.  Do not eat food after midnight the night before surgery.  No gum chewing, lozengers or hard candies.  You may however, drink CLEAR liquids up to 2 hours before you are scheduled to arrive for your surgery. Do not drink anything within 2 hours of the start of your surgery.  Clear liquids include: - water   Do NOT drink anything that is not on this list.  Type 1 and Type 2 diabetics should only drink water.  ENSURE PRE-SURGERY CARBOHYDRATE DRINK:  Complete drinking prior to leaving for the hospital for bariatric cases.  Follow Dr. Rutherford NailByrnett's order for the drink, (he wants it 3 hours before arrival to the hospital.)  No Alcohol for 24 hours before or after surgery.  No Smoking including e-cigarettes for 24 hours prior to surgery.  No chewable tobacco products for at least 6 hours prior to surgery.  No nicotine patches on the day of surgery.  On the morning of surgery brush your teeth with toothpaste and water, you may rinse your mouth with mouthwash if you wish. Do not swallow any toothpaste or mouthwash.  Notify your doctor if there is any change in your medical condition (cold, fever, infection).  Do not wear jewelry, make-up, hairpins, clips or nail polish.  Do not wear lotions, powders, or perfumes. You may wear deodorant.  Do not shave 48 hours prior to surgery. Men may shave face and neck.  Contacts and dentures may not be worn into surgery.  Do not bring valuables to the hospital, including drivers license, insurance or credit cards.  [facility] is not responsible for any belongings or valuables.   TAKE THESE MEDICATIONS THE MORNING OF SURGERY: none  Bring your C-PAP to the hospital with you in case you may have to spend the night.   Stop Metformin 2 days prior to surgery.  Take 1/2 of usual insulin dose the night before surgery and none on the morning of surgery.  Stop Anti-inflammatories (NSAIDS) such as Advil, Aleve, Ibuprofen, Motrin, Naproxen, Naprosyn and Aspirin based products such as Excedrin, Goodys Powder, BC Powder. (May take Tylenol or Acetaminophen if needed.)  Stop ANY OVER THE COUNTER supplements until after surgery. (May continue Vitamin D, Vitamin B, and multivitamin.)  Wear comfortable clothing (specific to your surgery type) to the hospital.   If you are being discharged the day of surgery, you will not be allowed to drive home. You will need a responsible adult to drive you home and stay with you that night.   If you are taking public transportation, you will need to have a responsible adult with you. Please confirm with your physician that it is acceptable to use public transportation.   Please call [phone] if you have any questions about these instructions.

## 2017-10-31 ENCOUNTER — Other Ambulatory Visit: Payer: Self-pay | Admitting: Radiology

## 2017-10-31 LAB — URINE CULTURE: Culture: NO GROWTH

## 2017-10-31 MED ORDER — CEFAZOLIN SODIUM-DEXTROSE 2-4 GM/100ML-% IV SOLN: 2.0000 g | INTRAVENOUS | Status: DC

## 2017-11-01 ENCOUNTER — Ambulatory Visit: Admitting: Anesthesiology

## 2017-11-01 ENCOUNTER — Encounter
Admission: RE | Disposition: A | Discharge status: Home / Self Care | Payer: Self-pay | Source: Ambulatory Visit | Attending: Urology

## 2017-11-01 ENCOUNTER — Ambulatory Visit
Admission: RE | Admit: 2017-11-01 | Discharge: 2017-11-01 | Disposition: A | Discharge status: Home / Self Care | Source: Ambulatory Visit | Attending: Urology | Admitting: Urology

## 2017-11-01 ENCOUNTER — Ambulatory Visit

## 2017-11-01 ENCOUNTER — Encounter: Payer: Self-pay | Admitting: Anesthesiology

## 2017-11-01 ENCOUNTER — Other Ambulatory Visit: Payer: Self-pay

## 2017-11-01 DIAGNOSIS — Z87891 Personal history of nicotine dependence: Secondary | ICD-10-CM | POA: Insufficient documentation

## 2017-11-01 DIAGNOSIS — R109 Unspecified abdominal pain: Secondary | ICD-10-CM

## 2017-11-01 DIAGNOSIS — G4730 Sleep apnea, unspecified: Secondary | ICD-10-CM | POA: Insufficient documentation

## 2017-11-01 DIAGNOSIS — E119 Type 2 diabetes mellitus without complications: Secondary | ICD-10-CM | POA: Insufficient documentation

## 2017-11-01 DIAGNOSIS — Z538 Procedure and treatment not carried out for other reasons: Secondary | ICD-10-CM | POA: Insufficient documentation

## 2017-11-01 DIAGNOSIS — I10 Essential (primary) hypertension: Secondary | ICD-10-CM | POA: Insufficient documentation

## 2017-11-01 DIAGNOSIS — Z79899 Other long term (current) drug therapy: Secondary | ICD-10-CM | POA: Insufficient documentation

## 2017-11-01 DIAGNOSIS — N132 Hydronephrosis with renal and ureteral calculous obstruction: Secondary | ICD-10-CM | POA: Diagnosis not present

## 2017-11-01 DIAGNOSIS — Z9989 Dependence on other enabling machines and devices: Secondary | ICD-10-CM | POA: Insufficient documentation

## 2017-11-01 DIAGNOSIS — N201 Calculus of ureter: Secondary | ICD-10-CM

## 2017-11-01 LAB — GLUCOSE, CAPILLARY: Glucose-Capillary: 94 mg/dL (ref 70–99)

## 2017-11-01 SURGERY — CYSTOSCOPY/URETEROSCOPY/HOLMIUM LASER/STENT PLACEMENT
Anesthesia: General | Laterality: Left

## 2017-11-01 MED ORDER — SODIUM CHLORIDE 0.9 % IV SOLN: INTRAVENOUS | Status: DC

## 2017-11-01 MED ORDER — GLYCOPYRROLATE 0.2 MG/ML IJ SOLN
INTRAMUSCULAR | Status: AC
  Filled 2017-11-01: qty 1

## 2017-11-01 MED ORDER — CEFAZOLIN SODIUM-DEXTROSE 2-4 GM/100ML-% IV SOLN
INTRAVENOUS | Status: DC
Start: 2017-11-01 — End: 2017-11-01
  Filled 2017-11-01: qty 100

## 2017-11-01 MED ORDER — FAMOTIDINE 20 MG PO TABS
ORAL_TABLET | ORAL | Status: AC
  Filled 2017-11-01: qty 1

## 2017-11-01 MED ORDER — FAMOTIDINE 20 MG PO TABS: 20.0000 mg | ORAL_TABLET | Freq: Once | ORAL | Status: DC

## 2017-11-01 MED ORDER — MIDAZOLAM HCL 2 MG/2ML IJ SOLN
INTRAMUSCULAR | Status: AC
  Filled 2017-11-01: qty 2

## 2017-11-01 MED ORDER — PROPOFOL 10 MG/ML IV BOLUS
INTRAVENOUS | Status: AC
  Filled 2017-11-01: qty 20

## 2017-11-01 MED ORDER — ONDANSETRON HCL 4 MG/2ML IJ SOLN
INTRAMUSCULAR | Status: AC
  Filled 2017-11-01: qty 2

## 2017-11-01 MED ORDER — DEXAMETHASONE SODIUM PHOSPHATE 10 MG/ML IJ SOLN
INTRAMUSCULAR | Status: AC
  Filled 2017-11-01: qty 1

## 2017-11-01 MED ORDER — LIDOCAINE HCL (PF) 2 % IJ SOLN
INTRAMUSCULAR | Status: AC
  Filled 2017-11-01: qty 10

## 2017-11-01 MED ORDER — FENTANYL CITRATE (PF) 100 MCG/2ML IJ SOLN
INTRAMUSCULAR | Status: AC
  Filled 2017-11-01: qty 2

## 2017-11-01 SURGICAL SUPPLY — 27 items
BAG DRAIN CYSTO-URO LG1000N (MISCELLANEOUS) ×3 IMPLANT
BASKET ZERO TIP 1.9FR (BASKET) IMPLANT
BRUSH SCRUB EZ 1% IODOPHOR (MISCELLANEOUS) ×3 IMPLANT
CATH URETL 5X70 OPEN END (CATHETERS) ×3 IMPLANT
CNTNR SPEC 2.5X3XGRAD LEK (MISCELLANEOUS)
CONRAY 43 FOR UROLOGY 50M (MISCELLANEOUS) ×3 IMPLANT
CONT SPEC 4OZ STER OR WHT (MISCELLANEOUS)
CONTAINER SPEC 2.5X3XGRAD LEK (MISCELLANEOUS) IMPLANT
DRAPE UTILITY 15X26 TOWEL STRL (DRAPES) ×3 IMPLANT
FIBER LASER LITHO 273 (Laser) IMPLANT
GLOVE BIO SURGEON STRL SZ8 (GLOVE) ×3 IMPLANT
GOWN STRL REUS W/ TWL LRG LVL3 (GOWN DISPOSABLE) ×2 IMPLANT
GOWN STRL REUS W/TWL LRG LVL3 (GOWN DISPOSABLE) ×4
INFUSOR MANOMETER BAG 3000ML (MISCELLANEOUS) ×3 IMPLANT
INTRODUCER DILATOR DOUBLE (INTRODUCER) IMPLANT
KIT TURNOVER CYSTO (KITS) ×3 IMPLANT
PACK CYSTO AR (MISCELLANEOUS) ×3 IMPLANT
SENSORWIRE 0.038 NOT ANGLED (WIRE) ×3
SET CYSTO W/LG BORE CLAMP LF (SET/KITS/TRAYS/PACK) ×3 IMPLANT
SHEATH URETERAL 12FRX35CM (MISCELLANEOUS) IMPLANT
SOL .9 NS 3000ML IRR  AL (IV SOLUTION) ×2
SOL .9 NS 3000ML IRR UROMATIC (IV SOLUTION) ×1 IMPLANT
STENT URET 6FRX24 CONTOUR (STENTS) IMPLANT
STENT URET 6FRX26 CONTOUR (STENTS) IMPLANT
SURGILUBE 2OZ TUBE FLIPTOP (MISCELLANEOUS) ×3 IMPLANT
WATER STERILE IRR 1000ML POUR (IV SOLUTION) ×3 IMPLANT
WIRE SENSOR 0.038 NOT ANGLED (WIRE) ×1 IMPLANT

## 2017-11-01 NOTE — OR Nursing (Signed)
After patient had conversation with Dr. Lonna CobbStoioff it was decided that he would not have surgery today. Pt. Sent for a KUB and Dr. Lonna CobbStoioff reports patient may go home after that and Dr. Lonna CobbStoioff will call the results of the xray to him later.

## 2017-11-01 NOTE — Anesthesia Preprocedure Evaluation (Signed)
Anesthesia Evaluation  Patient identified by MRN, date of birth, ID band Patient awake    Reviewed: Allergy & Precautions, NPO status , Patient's Chart, lab work & pertinent test results, reviewed documented beta blocker date and time   Airway Mallampati: II  TM Distance: >3 FB     Dental  (+) Chipped   Pulmonary sleep apnea and Continuous Positive Airway Pressure Ventilation , former smoker,           Cardiovascular hypertension, Pt. on medications      Neuro/Psych    GI/Hepatic   Endo/Other  diabetes, Type 2Hyperthyroidism   Renal/GU      Musculoskeletal   Abdominal   Peds  Hematology   Anesthesia Other Findings Obese. EKG ok.  Reproductive/Obstetrics                             Anesthesia Physical Anesthesia Plan  ASA: III  Anesthesia Plan: General   Post-op Pain Management:    Induction: Intravenous  PONV Risk Score and Plan:   Airway Management Planned: LMA  Additional Equipment:   Intra-op Plan:   Post-operative Plan:   Informed Consent: I have reviewed the patients History and Physical, chart, labs and discussed the procedure including the risks, benefits and alternatives for the proposed anesthesia with the patient or authorized representative who has indicated his/her understanding and acceptance.     Plan Discussed with: CRNA  Anesthesia Plan Comments:         Anesthesia Quick Evaluation

## 2017-11-01 NOTE — Progress Notes (Signed)
Mr. Hubbard RobinsonMcFarlane was seen in the ED on 08/02/2017 with left abdominal pain secondary to a 4 mm left proximal ureteral calculus.  He saw our physician's assistant on 10/01/2017 and had been asymptomatic.  He did have bilateral nephrolithiasis and after discussing options he elected to have left ureteroscopy.  He was inquiring today if this procedure was necessary.  He remains asymptomatic and most likely has passed the left proximal ureteral calculus and was informed he would need follow-up imaging to verify this.  Regarding his left renal calculi we discussed options of observation, shockwave lithotripsy and ureteroscopy.  I discussed ureteroscopy in detail including potential risks of bleeding, infection, ureteral injury and inability to access the upper tract due to anatomy.  In this event he would require stent placement and a follow-up procedure.  He has elected not to have ureteroscopy performed today.  I ordered a KUB t and he will be notified with results.  Will obtain a follow-up ultrasound to make sure his hydronephrosis has resolved.  I also recommended a metabolic evaluation and he will return for an office visit for further discussion.

## 2017-12-05 ENCOUNTER — Encounter: Payer: Self-pay | Admitting: Urology

## 2017-12-05 ENCOUNTER — Ambulatory Visit (INDEPENDENT_AMBULATORY_CARE_PROVIDER_SITE_OTHER): Admitting: Urology

## 2017-12-05 VITALS — BP 129/74 | HR 71 | Ht 72.0 in | Wt 295.1 lb

## 2017-12-05 DIAGNOSIS — N200 Calculus of kidney: Secondary | ICD-10-CM

## 2017-12-05 DIAGNOSIS — N132 Hydronephrosis with renal and ureteral calculous obstruction: Secondary | ICD-10-CM | POA: Diagnosis not present

## 2017-12-06 NOTE — Progress Notes (Signed)
12/05/2017 7:00 AM   [name] July 09, 1955 [id number]  Referring provider: No referring provider defined for this encounter.  Chief Complaint  Patient presents with  . Nephrolithiasis    HPI: 62 year old male presents for follow-up of stone disease.  He was originally scheduled on 11/01/2017 for left ureteroscopy.  He had seen our physician's assistant in early July for a 4 mm left proximal ureteral calculus.  The day of his surgery he was asymptomatic.  A KUB was performed and stone was not visualized.  He elected to hold off on any procedure.  He remains pain-free.  He did have bilateral, nonobstructing renal calculi on his CT.  He has no complaints today.   PMH: Past Medical History:  Diagnosis Date  . Diabetes mellitus without complication ([facility])   . Diverticulitis   . Graves disease   . History of kidney stones   . Hypertension   . OSA (obstructive sleep apnea)     Surgical History: Past Surgical History:  Procedure Laterality Date  . APPENDECTOMY    . KNEE SURGERY Right    Torn Meniscal Repair    Home Medications:  Allergies as of 12/05/2017   No Known Allergies     Medication List        Accurate as of 12/05/17 11:59 PM. Always use your most recent med list.          amLODipine-benazepril 10-40 MG capsule Commonly known as:  LOTREL Take 1 capsule by mouth daily.   atorvastatin 40 MG tablet Commonly known as:  LIPITOR Take 40 mg by mouth daily.   carvedilol 25 MG tablet Commonly known as:  COREG Take by mouth.   glipiZIDE 10 MG tablet Commonly known as:  GLUCOTROL Take by mouth.   hydrochlorothiazide 25 MG tablet Commonly known as:  HYDRODIURIL Take by mouth.   ibuprofen 600 MG tablet Commonly known as:  ADVIL,MOTRIN Take by mouth.   insulin glargine 100 UNIT/ML injection Commonly known as:  LANTUS Inject 1 mL (100 Units total) into the skin at bedtime.   levothyroxine 175 MCG tablet Commonly known as:  SYNTHROID, LEVOTHROID Take  175 mcg by mouth daily before breakfast.   metFORMIN 500 MG tablet Commonly known as:  GLUCOPHAGE Take 500 mg by mouth 2 (two) times daily with a meal.   sertraline 100 MG tablet Commonly known as:  ZOLOFT Take 100 mg by mouth daily.   VIAGRA 100 MG tablet Generic drug:  sildenafil Take 100 mg by mouth daily as needed.   Vitamin D (Ergocalciferol) 50000 units Caps capsule Commonly known as:  DRISDOL Take 50,000 Units by mouth every 7 (seven) days. Sundays       Allergies: No Known Allergies  Family History: Family History  Problem Relation Age of Onset  . Congestive Heart Failure Mother   . Diabetes Mother   . Healthy Father   . Prostate cancer Neg Hx   . Bladder Cancer Neg Hx   . Kidney cancer Neg Hx     Social History:  reports that he has quit smoking. He has never used smokeless tobacco. He reports that he drinks about 1.0 standard drinks of alcohol per week. He reports that he does not use drugs.  ROS: UROLOGY Frequent Urination?: No Hard to postpone urination?: No Burning/pain with urination?: No Get up at night to urinate?: No Leakage of urine?: No Urine stream starts and stops?: No Trouble starting stream?: No Do you have to strain to urinate?: No Blood in urine?:  No Urinary tract infection?: No Sexually transmitted disease?: No Injury to kidneys or bladder?: No Painful intercourse?: No Weak stream?: No Erection problems?: No Penile pain?: No  Gastrointestinal Nausea?: No Vomiting?: No Indigestion/heartburn?: No Diarrhea?: No Constipation?: No  Constitutional Fever: No Night sweats?: No Weight loss?: No Fatigue?: No  Skin Skin rash/lesions?: No Itching?: No  Eyes Blurred vision?: No Double vision?: No  Ears/Nose/Throat Sore throat?: No Sinus problems?: No  Hematologic/Lymphatic Swollen glands?: No Easy bruising?: No  Cardiovascular Leg swelling?: No Chest pain?: No  Respiratory Cough?: No Shortness of breath?:  No  Endocrine Excessive thirst?: No  Musculoskeletal Back pain?: No Joint pain?: No  Neurological Headaches?: No Dizziness?: No  Psychologic Depression?: No Anxiety?: No  Physical Exam: BP 129/74 (BP Location: Left Arm, Patient Position: Sitting, Cuff Size: Large)   Pulse 71   Ht 6' (1.829 m)   Wt 295 lb 1.6 oz (133.9 kg)   BMI 40.02 kg/m   Constitutional:  Alert and oriented, No acute distress. HEENT: [place] AT, moist mucus membranes.  Trachea midline, no masses.   Assessment & Plan:   His 4 mm left proximal ureteral calculus has most likely passed.  A renal ultrasound was ordered to document resolution/persistence of his hydronephrosis.  We discussed management options of his nonobstructing renal calculi including observation, shockwave lithotripsy and ureteroscopy.  He has elected observation for now and will follow-up in 3 months with a KUB.  I have recommended pursuing a metabolic evaluation to include blood work and a 24-hour urine study.  Return in about 3 months (around 03/06/2018) for KUB, Recheck.   Riki Altes, MD  [facility] [address] [place], [place] [address] [phone]

## 2017-12-08 ENCOUNTER — Encounter: Payer: Self-pay | Admitting: Urology

## 2018-03-07 ENCOUNTER — Ambulatory Visit: Admitting: Urology

## 2018-03-07 NOTE — Progress Notes (Incomplete)
03/07/2018  6:57 AM   David Oconnor 07/04/1955 [id number]  Referring provider: No referring provider defined for this encounter.  No chief complaint on file.   HPI: David Oconnor is a 62 yo M with a personal history of a left ureteral calculus and hydronephrosis presents today for a 3 month f/u for a KUB and a recheck. His last visit with [place] was on 12/05/2017.    PMH: Past Medical History:  Diagnosis Date   Diabetes mellitus without complication (HCC)    Diverticulitis    Graves disease    History of kidney stones    Hypertension    OSA (obstructive sleep apnea)     Surgical History: Past Surgical History:  Procedure Laterality Date   APPENDECTOMY     KNEE SURGERY Right    Torn Meniscal Repair    Home Medications:  Allergies as of 03/07/2018   No Known Allergies     Medication List        Accurate as of 03/07/18  6:57 AM. Always use your most recent med list.          amLODipine-benazepril 10-40 MG capsule Commonly known as:  LOTREL Take 1 capsule by mouth daily.   atorvastatin 40 MG tablet Commonly known as:  LIPITOR Take 40 mg by mouth daily.   carvedilol 25 MG tablet Commonly known as:  COREG Take by mouth.   glipiZIDE 10 MG tablet Commonly known as:  GLUCOTROL Take by mouth.   hydrochlorothiazide 25 MG tablet Commonly known as:  HYDRODIURIL Take by mouth.   ibuprofen 600 MG tablet Commonly known as:  ADVIL,MOTRIN Take by mouth.   insulin glargine 100 UNIT/ML injection Commonly known as:  LANTUS Inject 1 mL (100 Units total) into the skin at bedtime.   levothyroxine 175 MCG tablet Commonly known as:  SYNTHROID, LEVOTHROID Take 175 mcg by mouth daily before breakfast.   metFORMIN 500 MG tablet Commonly known as:  GLUCOPHAGE Take 500 mg by mouth 2 (two) times daily with a meal.   sertraline 100 MG tablet Commonly known as:  ZOLOFT Take 100 mg by mouth daily.   VIAGRA 100 MG tablet Generic drug:  sildenafil Take  100 mg by mouth daily as needed.   Vitamin D (Ergocalciferol) 1.25 MG (50000 UT) Caps capsule Commonly known as:  DRISDOL Take 50,000 Units by mouth every 7 (seven) days. Sundays       Allergies: No Known Allergies  Family History: Family History  Problem Relation Age of Onset   Congestive Heart Failure Mother    Diabetes Mother    Healthy Father    Prostate cancer Neg Hx    Bladder Cancer Neg Hx    Kidney cancer Neg Hx     Social History:  reports that he has quit smoking. He has never used smokeless tobacco. He reports that he drinks about 1.0 standard drinks of alcohol per week. He reports that he does not use drugs.  ROS:                                        Physical Exam: There were no vitals taken for this visit.  Constitutional:  Alert and oriented, No acute distress. HEENT: [place] AT, moist mucus membranes.  Trachea midline, no masses. Cardiovascular: No clubbing, cyanosis, or edema. Respiratory: Normal respiratory effort, no increased work of breathing. GI: Abdomen is soft, nontender, nondistended, no  abdominal masses GU: No CVA tenderness Lymph: No cervical or inguinal lymphadenopathy. Skin: No rashes, bruises or suspicious lesions. Neurologic: Grossly intact, no focal deficits, moving all 4 extremities. Psychiatric: Normal mood and affect.  Laboratory Data:  No results found for: PSA  No results found for: TESTOSTERONE  No results found for: HGBA1C  Urinalysis  Pertinent Imaging: *** Results for orders placed during the hospital encounter of 11/01/17  DG Abd 1 View - KUB   Narrative CLINICAL DATA:  Follow-up LEFT renal calculus.  EXAM: ABDOMEN - 1 VIEW  COMPARISON:  08/02/2017 CT.  FINDINGS: No definite calcifications are identified overlying the renal shadows or expected course of the ureters, although bowel gas obscures some detail.  The bowel gas pattern is unremarkable.  Surgical clips within the LEFT  abdomen noted.  No acute bony abnormalities identified.  IMPRESSION: No definite radiopaque urinary calculi identified on this study, but bowel gas obscures some detail.   Electronically Signed   By: Harmon PierJeffrey  Hu M.D.   On: 11/01/2017 10:38    No results found for this or any previous visit. No results found for this or any previous visit. No results found for this or any previous visit. No results found for this or any previous visit. No results found for this or any previous visit. No results found for this or any previous visit.   Assessment & Plan:   1. *** -*** @DIAGMED @  No follow-ups on file.  [facility]  [facility] [address], [address] [place], [place] [address] [phone]  I, Donne Hazelethusan Sivanesan, am acting as a scribe for Dr. Lorin PicketScott C. Stoioff,  {Add Holiday representativecribe Attestation Statement}

## 2018-04-14 ENCOUNTER — Emergency Department (HOSPITAL_COMMUNITY)

## 2018-04-14 ENCOUNTER — Other Ambulatory Visit: Payer: Self-pay

## 2018-04-14 ENCOUNTER — Encounter (HOSPITAL_COMMUNITY): Payer: Self-pay

## 2018-04-14 ENCOUNTER — Emergency Department (HOSPITAL_COMMUNITY)
Admission: EM | Admit: 2018-04-14 | Discharge: 2018-04-15 | Discharge status: Against Medical Advice | Attending: Emergency Medicine | Admitting: Emergency Medicine

## 2018-04-14 DIAGNOSIS — Z79899 Other long term (current) drug therapy: Secondary | ICD-10-CM | POA: Diagnosis not present

## 2018-04-14 DIAGNOSIS — R7989 Other specified abnormal findings of blood chemistry: Secondary | ICD-10-CM

## 2018-04-14 DIAGNOSIS — R000 Tachycardia, unspecified: Secondary | ICD-10-CM

## 2018-04-14 DIAGNOSIS — Z87891 Personal history of nicotine dependence: Secondary | ICD-10-CM | POA: Diagnosis not present

## 2018-04-14 DIAGNOSIS — R946 Abnormal results of thyroid function studies: Secondary | ICD-10-CM | POA: Diagnosis not present

## 2018-04-14 DIAGNOSIS — I10 Essential (primary) hypertension: Secondary | ICD-10-CM | POA: Insufficient documentation

## 2018-04-14 DIAGNOSIS — E119 Type 2 diabetes mellitus without complications: Secondary | ICD-10-CM | POA: Insufficient documentation

## 2018-04-14 LAB — CBC
HCT: 39.2 % (ref 39.0–52.0)
Hemoglobin: 12.7 g/dL — ABNORMAL LOW (ref 13.0–17.0)
MCH: 30.8 pg (ref 26.0–34.0)
MCHC: 32.4 g/dL (ref 30.0–36.0)
MCV: 94.9 fL (ref 80.0–100.0)
Platelets: 301 10*3/uL (ref 150–400)
RBC: 4.13 MIL/uL — ABNORMAL LOW (ref 4.22–5.81)
RDW: 14.1 % (ref 11.5–15.5)
WBC: 10 10*3/uL (ref 4.0–10.5)
nRBC: 0 % (ref 0.0–0.2)

## 2018-04-14 LAB — BASIC METABOLIC PANEL
Anion gap: 11 (ref 5–15)
BUN: 26 mg/dL — ABNORMAL HIGH (ref 8–23)
CO2: 21 mmol/L — ABNORMAL LOW (ref 22–32)
Calcium: 8.5 mg/dL — ABNORMAL LOW (ref 8.9–10.3)
Chloride: 107 mmol/L (ref 98–111)
Creatinine, Ser: 1.83 mg/dL — ABNORMAL HIGH (ref 0.61–1.24)
GFR calc Af Amer: 44 mL/min — ABNORMAL LOW (ref >60)
GFR calc non Af Amer: 38 mL/min — ABNORMAL LOW (ref >60)
Glucose, Bld: 197 mg/dL — ABNORMAL HIGH (ref 70–99)
Potassium: 4.2 mmol/L (ref 3.5–5.1)
Sodium: 139 mmol/L (ref 135–145)

## 2018-04-14 LAB — D-DIMER, QUANTITATIVE: D-Dimer, Quant: 0.53 ug/mL-FEU — ABNORMAL HIGH (ref 0.00–0.50)

## 2018-04-14 MED ORDER — SODIUM CHLORIDE 0.9 % IV BOLUS
1000.0000 mL | Freq: Once | INTRAVENOUS | Status: AC
  Administered 2018-04-15: 1000 mL via INTRAVENOUS

## 2018-04-14 MED ORDER — IOPAMIDOL (ISOVUE-370) INJECTION 76%
100.0000 mL | Freq: Once | INTRAVENOUS | Status: AC | PRN
  Administered 2018-04-14: 80 mL via INTRAVENOUS

## 2018-04-14 MED ORDER — SODIUM CHLORIDE 0.9 % IV BOLUS
1000.0000 mL | Freq: Once | INTRAVENOUS | Status: AC
  Administered 2018-04-14: 1000 mL via INTRAVENOUS

## 2018-04-14 MED ORDER — IOPAMIDOL (ISOVUE-370) INJECTION 76%
INTRAVENOUS | Status: AC
  Filled 2018-04-14: qty 100

## 2018-04-14 MED ORDER — SODIUM CHLORIDE (PF) 0.9 % IJ SOLN
INTRAMUSCULAR | Status: AC
  Filled 2018-04-14: qty 50

## 2018-04-14 NOTE — ED Triage Notes (Signed)
Pt [facility] for [facility]. Pt states that his heart rate would not come down while there for 45 minutes, from 155. Pt denies chest pain.  Pt states it feels like something is sitting on his chest on and off.

## 2018-04-14 NOTE — ED Notes (Signed)
Patient transported to X-ray 

## 2018-04-14 NOTE — ED Notes (Signed)
Patient transported to CT 

## 2018-04-14 NOTE — ED Provider Notes (Signed)
[facility]-EMERGENCY DEPT Provider Note   CSN: [id number] Arrival date & time: 04/14/18  1821     History   Chief Complaint Chief Complaint  Patient presents with  . Tachycardia    HPI David Oconnor is a 63 y.o. male.  HPI   David Oconnor is a 63 y.o. male, with a history of Graves' disease in his 60s, HTN, DM, presenting to the ED with tachycardia noted today during an orthopedic appointment.   Patient was told to come to the ED for evaluation. He has no current complaints.  Endorses less fluid intake than normal lately. He will occasionally drink a [employer] drink, but last one was days ago. He has been upset lately because he was let go from his job last week.  He has had intermittent episodes of chest pressure, central, nonradiating, lasting for about 2 hours at a time. Comes on at rest or ambulation.  Last episode was 2 days ago.  The only medication change he notes is that approximately 3 weeks ago he ran out of his 175 mcg Synthroid and decided to use a leftover prescription of his 125 mcg Synthroid.  Denies fever/chills, current chest discomfort, SOB, N/V/D, abdominal pain, syncope, palpitations, cough, lower extremity edema/pain, or any other complaints.    Past Medical History:  Diagnosis Date  . Diabetes mellitus without complication (HCC)   . Diverticulitis   . Graves disease   . History of kidney stones   . Hypertension   . OSA (obstructive sleep apnea)     Patient Active Problem List   Diagnosis Date Noted  . HTN (hypertension) 10/24/2017  . Type 2 diabetes mellitus (HCC) 10/24/2017  . Cervical disc herniation 12/16/2014  . Cervical radicular pain 12/16/2014  . Obstructive sleep apnea 12/29/2012  . Other meniscus derangements, posterior horn of medial meniscus, unspecified knee 09/02/2012    Past Surgical History:  Procedure Laterality Date  . APPENDECTOMY    . KNEE SURGERY Right    Torn Meniscal Repair         Home Medications    Prior to Admission medications   Medication Sig Start Date End Date Taking? Authorizing Provider  amLODipine-benazepril (LOTREL) 10-40 MG capsule Take 1 capsule by mouth daily.   Yes [provider]  atorvastatin (LIPITOR) 40 MG tablet Take 40 mg by mouth daily. 05/31/17  Yes [provider]  diclofenac sodium (VOLTAREN) 1 % GEL Apply 1 application topically 3 (three) times daily as needed (pain).  01/29/18 04/29/18 Yes [provider]  insulin glargine (LANTUS) 100 UNIT/ML injection Inject 1 mL (100 Units total) into the skin at bedtime. Patient taking differently: Inject 36 Units into the skin every morning.  08/27/14  Yes Peyton Najjar, MD  levothyroxine (SYNTHROID, LEVOTHROID) 175 MCG tablet Take 175 mcg by mouth daily before breakfast.    Yes [provider]  metFORMIN (GLUCOPHAGE) 500 MG tablet Take 500 mg by mouth 2 (two) times daily with a meal.    Yes [provider]  sertraline (ZOLOFT) 100 MG tablet Take 100 mg by mouth daily.   Yes [provider]    Family History Family History  Problem Relation Age of Onset  . Congestive Heart Failure Mother   . Diabetes Mother   . Healthy Father   . Prostate cancer Neg Hx   . Bladder Cancer Neg Hx   . Kidney cancer Neg Hx     Social History Social History   Tobacco Use  .  Smoking status: Former Games developermoker  . Smokeless tobacco: Never Used  . Tobacco comment: Quit 40+ years ago.  Substance Use Topics  . Alcohol use: Yes    Alcohol/week: 1.0 standard drinks    Types: 1 Cans of beer per week  . Drug use: No     Allergies   Patient has no known allergies.   Review of Systems Review of Systems  Constitutional: Negative for chills and fever.  HENT: Negative for congestion.   Respiratory: Negative for cough and shortness of breath.   Cardiovascular: Negative for chest pain, palpitations and leg swelling.  Gastrointestinal: Negative for abdominal  pain, blood in stool, diarrhea, nausea and vomiting.  Genitourinary: Negative for dysuria, flank pain, frequency and hematuria.  Musculoskeletal: Negative for back pain.  Skin: Negative for rash.  Neurological: Negative for dizziness, syncope, weakness, light-headedness, numbness and headaches.  All other systems reviewed and are negative.    Physical Exam Updated Vital Signs BP 108/75 (BP Location: Left Arm)   Pulse (!) 138   Temp 98.1 F (36.7 C) (Oral)   Resp 16   Ht 6' (1.829 m)   Wt 135.2 kg   SpO2 100%   BMI 40.42 kg/m   Physical Exam Vitals signs and nursing note reviewed.  Constitutional:      General: He is not in acute distress.    Appearance: He is well-developed. He is obese. He is not diaphoretic.  HENT:     Head: Normocephalic and atraumatic.     Mouth/Throat:     Mouth: Mucous membranes are moist.     Pharynx: Oropharynx is clear.  Eyes:     Conjunctiva/sclera: Conjunctivae normal.  Neck:     Musculoskeletal: Neck supple.  Cardiovascular:     Rate and Rhythm: Regular rhythm. Tachycardia present.     Pulses: Normal pulses.     Heart sounds: Normal heart sounds.  Pulmonary:     Effort: Pulmonary effort is normal. No respiratory distress.     Breath sounds: Normal breath sounds.  Abdominal:     Palpations: Abdomen is soft.     Tenderness: There is no abdominal tenderness. There is no guarding.  Musculoskeletal:     Right lower leg: No edema.     Left lower leg: No edema.  Lymphadenopathy:     Cervical: No cervical adenopathy.  Skin:    General: Skin is warm and dry.  Neurological:     Mental Status: He is alert.  Psychiatric:        Mood and Affect: Mood and affect normal.        Speech: Speech normal.        Behavior: Behavior normal.      ED Treatments / Results  Labs (all labs ordered are listed, but only abnormal results are displayed) Labs Reviewed  BASIC METABOLIC PANEL - Abnormal; Notable for the following components:      Result  Value   CO2 21 (*)    Glucose, Bld 197 (*)    BUN 26 (*)    Creatinine, Ser 1.83 (*)    Calcium 8.5 (*)    GFR calc non Af Amer 38 (*)    GFR calc Af Amer 44 (*)    All other components within normal limits  CBC - Abnormal; Notable for the following components:   RBC 4.13 (*)    Hemoglobin 12.7 (*)    All other components within normal limits  D-DIMER, QUANTITATIVE (NOT AT [facility]) - Abnormal; Notable for  the following components:   D-Dimer, Quant 0.53 (*)    All other components within normal limits  TSH - Abnormal; Notable for the following components:   TSH 32.491 (*)    All other components within normal limits  T4, FREE  T3, FREE  I-STAT TROPONIN, ED    EKG EKG Interpretation  Date/Time:  Monday April 14 2018 18:34:54 EST Ventricular Rate:  139 PR Interval:    QRS Duration: 87 QT Interval:  302 QTC Calculation: 460 R Axis:   -50 Text Interpretation:  Sinus tachycardia Left anterior fascicular block Abnormal R-wave progression, late transition Confirmed by Raeford Razor [phone]) on 04/14/2018 7:30:05 PM   Radiology Dg Chest 2 View  Result Date: 04/14/2018 CLINICAL DATA:  Chest pain.  Hypertension. EXAM: CHEST - 2 VIEW COMPARISON:  None. FINDINGS: Lungs are clear. Heart size and pulmonary vascularity are normal. No adenopathy. There is aortic atherosclerosis. No pneumothorax. There is mild degenerative change in the thoracic spine. IMPRESSION: No edema or consolidation.  Aortic atherosclerosis. Aortic Atherosclerosis (ICD10-I70.0). Electronically Signed   By: Bretta Bang III M.D.   On: 04/14/2018 19:52   Ct Angio Chest Pe W And/or Wo Contrast  Result Date: 04/14/2018 CLINICAL DATA:  Patient feels like something is sitting on his chest. Positive D-dimer. EXAM: CT ANGIOGRAPHY CHEST WITH CONTRAST TECHNIQUE: Multidetector CT imaging of the chest was performed using the standard protocol during bolus administration of intravenous contrast. Multiplanar CT image  reconstructions and MIPs were obtained to evaluate the vascular anatomy. CONTRAST:  53mL ISOVUE-370 IOPAMIDOL (ISOVUE-370) INJECTION 76% COMPARISON:  None. FINDINGS: Cardiovascular: There is good opacification of the central and segmental pulmonary arteries. No focal filling defects. No evidence of significant pulmonary embolus. Normal caliber thoracic aorta with a few calcifications. Normal heart size. No pericardial effusions. Mediastinum/Nodes: No significant lymphadenopathy in the chest. Esophagus is decompressed. Soft tissue nodule in the subcutaneous fat posterior to the left posterolateral eleventh rib. Possible sebaceous cyst or hematoma. Lungs/Pleura: Atelectasis in the lung bases. No airspace disease or consolidation. No pleural effusions. No pneumothorax. Airways are patent. Upper Abdomen: No acute abnormalities demonstrated. Postoperative changes consistent with gastric bypass. Musculoskeletal: Degenerative changes in the spine. No destructive bone lesions. Review of the MIP images confirms the above findings. IMPRESSION: No evidence of significant pulmonary embolus. No evidence of active pulmonary disease. Electronically Signed   By: Burman Nieves M.D.   On: 04/14/2018 23:26    Procedures Procedures (including critical care time)  Medications Ordered in ED Medications  sodium chloride (PF) 0.9 % injection (has no administration in time range)  iopamidol (ISOVUE-370) 76 % injection (has no administration in time range)  propranolol (INDERAL) injection 1 mg (has no administration in time range)  sodium chloride 0.9 % bolus 1,000 mL (0 mLs Intravenous Stopped 04/14/18 2135)  sodium chloride 0.9 % bolus 1,000 mL (1,000 mLs Intravenous New Bag/Given 04/15/18 0000)  iopamidol (ISOVUE-370) 76 % injection 100 mL (80 mLs Intravenous Contrast Given 04/14/18 2300)     Initial Impression / Assessment and Plan / ED Course  I have reviewed the triage vital signs and the nursing notes.  Pertinent  labs & imaging results that were available during my care of the patient were reviewed by me and considered in my medical decision making (see chart for details).     Patient presents with tachycardia. Patient is nontoxic appearing, afebrile, not tachypneic, not hypotensive, maintains excellent SPO2 on room air, and is in no apparent distress.  He had no complaints  during my time with the patient. Slight elevation in BUN and creatinine give suspicion for dehydration. Tachycardia improved from the high 140s to the 120s with IV fluids.  D-dimer was slightly elevated, but no evidence of PE or other acute abnormality on CTA.  End of shift patient care handoff report given to Frederik Pear, PA-C. Plan: Continue testing.  Will likely need further thyroid testing.   Findings and plan of care discussed with Raeford Razor, MD.   Vitals:   04/14/18 2100 04/14/18 2200 04/15/18 0000 04/15/18 0038  BP: 115/85 113/84 (!) 134/96 (!) 129/93  Pulse: (!) 127 (!) 125 (!) 125 (!) 124  Resp: 16 16 14 14   Temp:      TempSrc:      SpO2: 97% 97% 99% 98%  Weight:      Height:         Final Clinical Impressions(s) / ED Diagnoses   Final diagnoses:  None    ED Discharge Orders    None       Concepcion Living 04/15/18 0119    Raeford Razor, MD 04/22/18 1156

## 2018-04-15 LAB — I-STAT TROPONIN, ED: Troponin i, poc: 0.02 ng/mL (ref 0.00–0.08)

## 2018-04-15 LAB — T4, FREE: Free T4: <0.25 ng/dL — ABNORMAL LOW (ref 0.82–1.77)

## 2018-04-15 LAB — TSH: TSH: 32.491 u[IU]/mL — AB (ref 0.350–4.500)

## 2018-04-15 MED ORDER — PROPRANOLOL HCL 1 MG/ML IV SOLN
1.0000 mg | Freq: Once | INTRAVENOUS | Status: AC
  Administered 2018-04-15: 1 mg via INTRAVENOUS
  Filled 2018-04-15: qty 1

## 2018-04-15 NOTE — Discharge Instructions (Addendum)
Thank you for allowing me to provide your care today in the emergency department.  You are leaving AGAINST MEDICAL ADVICE.  Consequences of leaving AGAINST MEDICAL ADVICE can lead up to and include death.  You can always return to the emergency department at anytime for further evaluation work-up if your symptoms worsen or if you develop new symptoms.  I strongly recommend following up with your primary care provider tomorrow.  Your TSH level has resulted and is very high, but your free T3 and free T4 pending.   Return to the emergency department if your heart rate does not improve, if you pass out, feel dizzy, develop chest pain or shortness of breath, or other new, concerning symptoms.

## 2018-04-16 LAB — T3, FREE: T3, Free: 0.4 pg/mL — ABNORMAL LOW (ref 2.0–4.4)

## 2019-03-04 IMAGING — CR DG CHEST 2V
2 series · 2 of 2 positions shown · non-contrast
Comparison: None.

CLINICAL DATA: Chest pain.  Hypertension.

EXAM:
CHEST - 2 VIEW

[w chest pa]
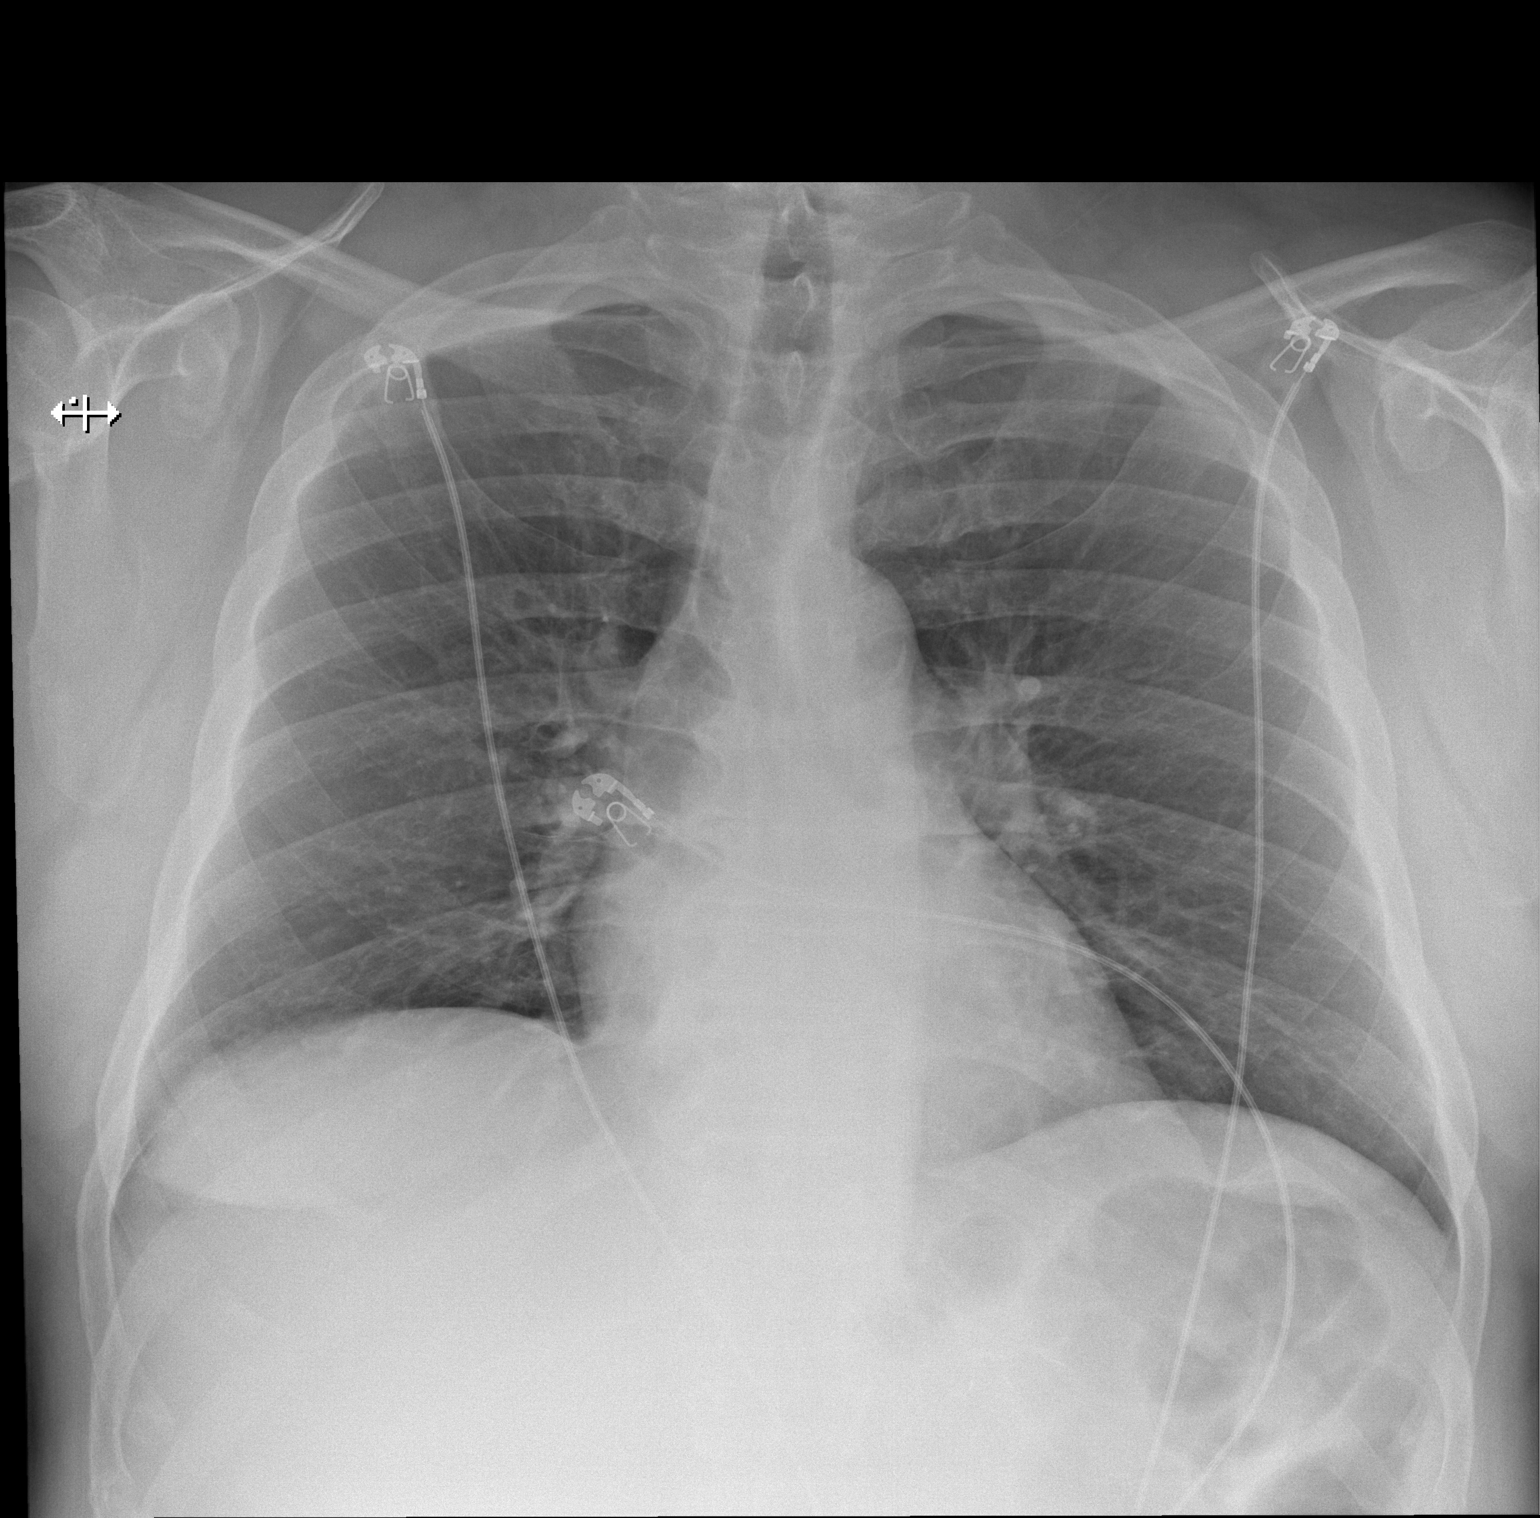

[w chest lat]
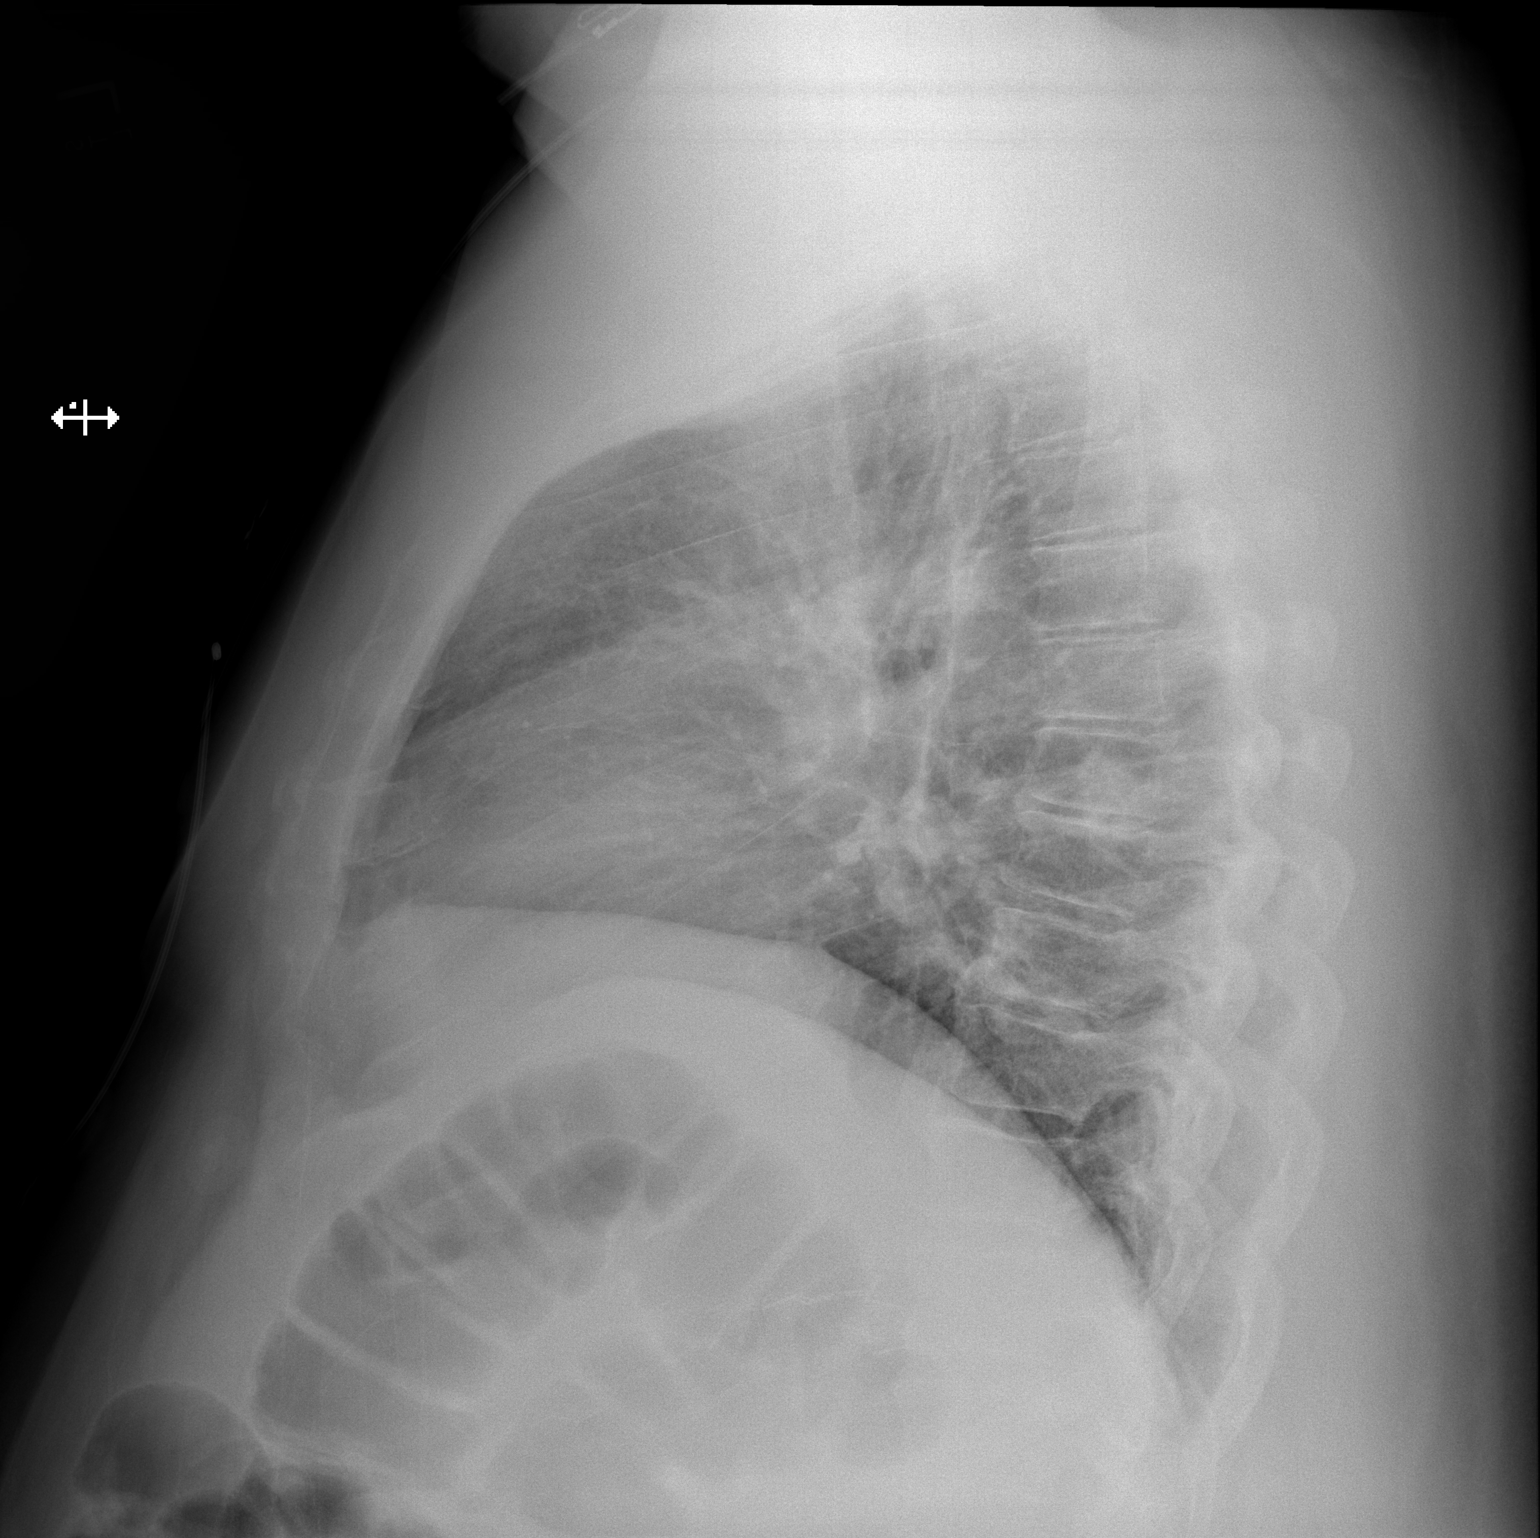

[2 of 2 positions shown; findings below may reference images not displayed]

FINDINGS: Lungs are clear. Heart size and pulmonary vascularity are normal. No
adenopathy. There is aortic atherosclerosis. No pneumothorax. There
is mild degenerative change in the thoracic spine.
IMPRESSION: No edema or consolidation.  Aortic atherosclerosis.

Aortic Atherosclerosis (H055P-3MG.G).
